# Patient Record
Sex: Female | Born: 1985
Health system: Southern US, Community
[De-identification: ages and names within clinical notes are randomized; demographics above are authoritative.]

## PROBLEM LIST (undated history)

## (undated) DIAGNOSIS — R102 Pelvic and perineal pain: Secondary | ICD-10-CM

## (undated) DIAGNOSIS — R519 Headache, unspecified: Secondary | ICD-10-CM

## (undated) DIAGNOSIS — D649 Anemia, unspecified: Secondary | ICD-10-CM

## (undated) DIAGNOSIS — L0293 Carbuncle, unspecified: Secondary | ICD-10-CM

## (undated) DIAGNOSIS — D170 Benign lipomatous neoplasm of skin and subcutaneous tissue of head, face and neck: Secondary | ICD-10-CM

## (undated) DIAGNOSIS — L0292 Furuncle, unspecified: Secondary | ICD-10-CM

## (undated) DIAGNOSIS — R51 Headache: Secondary | ICD-10-CM

## (undated) DIAGNOSIS — R1011 Right upper quadrant pain: Secondary | ICD-10-CM

## (undated) DIAGNOSIS — N946 Dysmenorrhea, unspecified: Secondary | ICD-10-CM

## (undated) DIAGNOSIS — R112 Nausea with vomiting, unspecified: Secondary | ICD-10-CM

## (undated) HISTORY — DX: Nausea with vomiting, unspecified: R11.2

## (undated) HISTORY — DX: Pelvic and perineal pain: R10.2

## (undated) HISTORY — DX: Right upper quadrant pain: R10.11

## (undated) HISTORY — PX: APPENDECTOMY: SHX54

## (undated) HISTORY — DX: Dysmenorrhea, unspecified: N94.6

## (undated) HISTORY — DX: Carbuncle, unspecified: L02.93

## (undated) HISTORY — DX: Furuncle, unspecified: L02.92

## (undated) HISTORY — DX: Benign lipomatous neoplasm of skin and subcutaneous tissue of head, face and neck: D17.0

## (undated) HISTORY — DX: Headache, unspecified: R51.9

## (undated) HISTORY — DX: Headache: R51

---
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
## 2005-08-12 ENCOUNTER — Ambulatory Visit (HOSPITAL_COMMUNITY): Admission: RE | Admit: 2005-08-12 | Discharge: 2005-08-12 | Payer: Self-pay | Admitting: Family Medicine

## 2005-11-04 ENCOUNTER — Inpatient Hospital Stay (HOSPITAL_COMMUNITY): Admission: AD | Admit: 2005-11-04 | Discharge: 2005-11-04 | Payer: Self-pay | Admitting: Obstetrics and Gynecology

## 2005-11-04 ENCOUNTER — Ambulatory Visit: Payer: Self-pay | Admitting: Gynecology

## 2005-11-04 ENCOUNTER — Inpatient Hospital Stay (HOSPITAL_COMMUNITY): Admission: AD | Admit: 2005-11-04 | Discharge: 2005-11-04 | Payer: Self-pay | Admitting: Gynecology

## 2005-11-04 ENCOUNTER — Inpatient Hospital Stay (HOSPITAL_COMMUNITY): Admission: AD | Admit: 2005-11-04 | Discharge: 2005-11-06 | Payer: Self-pay | Admitting: Gynecology

## 2006-04-24 ENCOUNTER — Emergency Department (HOSPITAL_COMMUNITY): Admission: EM | Admit: 2006-04-24 | Discharge: 2006-04-24 | Payer: Self-pay | Admitting: Emergency Medicine

## 2006-07-13 ENCOUNTER — Emergency Department (HOSPITAL_COMMUNITY): Admission: EM | Admit: 2006-07-13 | Discharge: 2006-07-13 | Payer: Self-pay | Admitting: Emergency Medicine

## 2014-09-12 ENCOUNTER — Encounter (HOSPITAL_BASED_OUTPATIENT_CLINIC_OR_DEPARTMENT_OTHER): Payer: Self-pay | Admitting: *Deleted

## 2014-09-12 ENCOUNTER — Emergency Department (HOSPITAL_BASED_OUTPATIENT_CLINIC_OR_DEPARTMENT_OTHER): Payer: Medicaid Other

## 2014-09-12 ENCOUNTER — Emergency Department (HOSPITAL_BASED_OUTPATIENT_CLINIC_OR_DEPARTMENT_OTHER)
Admission: EM | Admit: 2014-09-12 | Discharge: 2014-09-12 | Disposition: A | Discharge status: Home / Self Care | Payer: Medicaid Other | Attending: Emergency Medicine | Admitting: Emergency Medicine

## 2014-09-12 DIAGNOSIS — Y9389 Activity, other specified: Secondary | ICD-10-CM | POA: Diagnosis not present

## 2014-09-12 DIAGNOSIS — S63602A Unspecified sprain of left thumb, initial encounter: Secondary | ICD-10-CM

## 2014-09-12 DIAGNOSIS — Y998 Other external cause status: Secondary | ICD-10-CM | POA: Insufficient documentation

## 2014-09-12 DIAGNOSIS — Y9289 Other specified places as the place of occurrence of the external cause: Secondary | ICD-10-CM | POA: Diagnosis not present

## 2014-09-12 DIAGNOSIS — S6992XA Unspecified injury of left wrist, hand and finger(s), initial encounter: Secondary | ICD-10-CM | POA: Diagnosis present

## 2014-09-12 DIAGNOSIS — Z720 Tobacco use: Secondary | ICD-10-CM | POA: Diagnosis not present

## 2014-09-12 DIAGNOSIS — W51XXXA Accidental striking against or bumped into by another person, initial encounter: Secondary | ICD-10-CM | POA: Diagnosis not present

## 2014-09-12 MED ORDER — IBUPROFEN 800 MG PO TABS
800.0000 mg | ORAL_TABLET | Freq: Once | ORAL | Status: AC
  Administered 2014-09-12: 800 mg via ORAL
  Filled 2014-09-12: qty 1
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
## 2014-09-12 NOTE — ED Notes (Signed)
Patient transported to X-ray 

## 2014-09-12 NOTE — ED Notes (Signed)
Pt provided ice pack and instructed to elevate hand for comfort measures

## 2014-09-12 NOTE — Discharge Instructions (Signed)
Take ibuprofen or naproxen every 6-8 hours as needed for pain.  Finger Sprain A finger sprain is a tear in one of the strong, fibrous tissues that connect the bones (ligaments) in your finger. The severity of the sprain depends on how much of the ligament is torn. The tear can be either partial or complete. CAUSES  Often, sprains are a result of a fall or accident. If you extend your hands to catch an object or to protect yourself, the force of the impact causes the fibers of your ligament to stretch too much. This excess tension causes the fibers of your ligament to tear. SYMPTOMS  You may have some loss of motion in your finger. Other symptoms include:  Bruising.  Tenderness.  Swelling. DIAGNOSIS  In order to diagnose finger sprain, your caregiver will physically examine your finger or thumb to determine how torn the ligament is. Your caregiver may also suggest an X-ray exam of your finger to make sure no bones are broken. TREATMENT  If your ligament is only partially torn, treatment usually involves keeping the finger in a fixed position (immobilization) for a short period. To do this, your caregiver will apply a bandage, cast, or splint to keep your finger from moving until it heals. For a partially torn ligament, the healing process usually takes 2 to 3 weeks. If your ligament is completely torn, you may need surgery to reconnect the ligament to the bone. After surgery a cast or splint will be applied and will need to stay on your finger or thumb for 4 to 6 weeks while your ligament heals. HOME CARE INSTRUCTIONS  Keep your injured finger elevated, when possible, to decrease swelling.  To ease pain and swelling, apply ice to your joint twice a day, for 2 to 3 days:  Put ice in a plastic bag.  Place a towel between your skin and the bag.  Leave the ice on for 15 minutes.  Only take over-the-counter or prescription medicine for pain as directed by your caregiver.  Do not wear rings  on your injured finger.  Do not leave your finger unprotected until pain and stiffness go away (usually 3 to 4 weeks).  Do not allow your cast or splint to get wet. Cover your cast or splint with a plastic bag when you shower or bathe. Do not swim.  Your caregiver may suggest special exercises for you to do during your recovery to prevent or limit permanent stiffness. SEEK IMMEDIATE MEDICAL CARE IF:  Your cast or splint becomes damaged.  Your pain becomes worse rather than better. MAKE SURE YOU:  Understand these instructions.  Will watch your condition.  Will get help right away if you are not doing well or get worse. Document Released: 04/16/2004 Document Revised: 06/01/2011 Document Reviewed: 11/10/2010 [facility] Patient Information 2015 [place], [place]. This information is not intended to replace advice given to you by your health care provider. Make sure you discuss any questions you have with your health care provider. RICE: Routine Care for Injuries The routine care of many injuries includes Rest, Ice, Compression, and Elevation (RICE). HOME CARE INSTRUCTIONS  Rest is needed to allow your body to heal. Routine activities can usually be resumed when comfortable. Injured tendons and bones can take up to 6 weeks to heal. Tendons are the cord-like structures that attach muscle to bone.  Ice following an injury helps keep the swelling down and reduces pain.  Put ice in a plastic bag.  Place a towel between  your skin and the bag.  Leave the ice on for 15-20 minutes, 3-4 times a day, or as directed by your health care provider. Do this while awake, for the first 24 to 48 hours. After that, continue as directed by your caregiver.  Compression helps keep swelling down. It also gives support and helps with discomfort. If an elastic bandage has been applied, it should be removed and reapplied every 3 to 4 hours. It should not be applied tightly, but firmly enough to keep swelling down.  Watch fingers or toes for swelling, bluish discoloration, coldness, numbness, or excessive pain. If any of these problems occur, remove the bandage and reapply loosely. Contact your caregiver if these problems continue.  Elevation helps reduce swelling and decreases pain. With extremities, such as the arms, hands, legs, and feet, the injured area should be placed near or above the level of the heart, if possible. SEEK IMMEDIATE MEDICAL CARE IF:  You have persistent pain and swelling.  You develop redness, numbness, or unexpected weakness.  Your symptoms are getting worse rather than improving after several days. These symptoms may indicate that further evaluation or further X-rays are needed. Sometimes, X-rays may not show a small broken bone (fracture) until 1 week or 10 days later. Make a follow-up appointment with your caregiver. Ask when your X-ray results will be ready. Make sure you get your X-ray results. Document Released: 06/21/2000 Document Revised: 03/14/2013 Document Reviewed: 08/08/2010 [facility] Patient Information 2015 [place], [place]. This information is not intended to replace advice given to you by your health care provider. Make sure you discuss any questions you have with your health care provider.
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
## 2015-07-04 ENCOUNTER — Other Ambulatory Visit: Payer: Self-pay | Admitting: Obstetrics & Gynecology

## 2015-07-04 DIAGNOSIS — O3680X0 Pregnancy with inconclusive fetal viability, not applicable or unspecified: Secondary | ICD-10-CM

## 2015-07-09 ENCOUNTER — Encounter: Payer: Self-pay | Admitting: Adult Health

## 2015-07-09 ENCOUNTER — Ambulatory Visit (INDEPENDENT_AMBULATORY_CARE_PROVIDER_SITE_OTHER): Payer: Medicaid Other | Admitting: Adult Health

## 2015-07-09 ENCOUNTER — Ambulatory Visit: Payer: Medicaid Other

## 2015-07-09 VITALS — BP 120/82 | HR 68 | Ht 61.0 in | Wt 190.5 lb

## 2015-07-09 DIAGNOSIS — N9489 Other specified conditions associated with female genital organs and menstrual cycle: Secondary | ICD-10-CM

## 2015-07-09 DIAGNOSIS — R102 Pelvic and perineal pain: Secondary | ICD-10-CM | POA: Insufficient documentation

## 2015-07-09 DIAGNOSIS — R1011 Right upper quadrant pain: Secondary | ICD-10-CM | POA: Insufficient documentation

## 2015-07-09 DIAGNOSIS — N920 Excessive and frequent menstruation with regular cycle: Secondary | ICD-10-CM | POA: Diagnosis not present

## 2015-07-09 DIAGNOSIS — N946 Dysmenorrhea, unspecified: Secondary | ICD-10-CM

## 2015-07-09 DIAGNOSIS — Z3202 Encounter for pregnancy test, result negative: Secondary | ICD-10-CM

## 2015-07-09 DIAGNOSIS — R112 Nausea with vomiting, unspecified: Secondary | ICD-10-CM

## 2015-07-09 HISTORY — DX: Right upper quadrant pain: R10.11

## 2015-07-09 HISTORY — DX: Pelvic and perineal pain: R10.2

## 2015-07-09 HISTORY — DX: Nausea with vomiting, unspecified: R11.2

## 2015-07-09 HISTORY — DX: Dysmenorrhea, unspecified: N94.6

## 2015-07-09 LAB — POCT URINALYSIS DIPSTICK
Blood, UA: NEGATIVE
Glucose, UA: NEGATIVE
LEUKOCYTES UA: NEGATIVE
Nitrite, UA: NEGATIVE

## 2015-07-09 LAB — POCT URINE PREGNANCY: Preg Test, Ur: NEGATIVE

## 2015-07-09 MED ORDER — PROMETHAZINE HCL 25 MG PO TABS: 25.0000 mg | ORAL_TABLET | Freq: Four times a day (QID) | ORAL | Status: DC | PRN

## 2015-07-09 MED ORDER — IBUPROFEN 800 MG PO TABS: 800.0000 mg | ORAL_TABLET | Freq: Three times a day (TID) | ORAL | Status: DC | PRN

## 2015-07-09 NOTE — Progress Notes (Signed)
Subjective:     Patient ID: Cassidy Braun, female   DOB: 07-29-1985, 30 y.o.   MRN: [id number]  HPI Cassidy Braun is a 30 year old black female, in as work in, she had had [place] scheduled for dating OB, but had negative UPT.She was seen at [facility] for a pap and physical 3/23 and had +UPT, and started bleeding 3/27 and was seen at [facility] and had negative pregnancy test.She says for last few months periods are heavier and has cramps.She also complains of RUQ pain and nausea and vomiting before and after eating,and pelvic pressure, she says she would like another child some time.  Review of Systems Patient denies any daily headaches(has has frequent headaches has lipoma of head and needs to see MD at [facility]), hearing loss, fatigue, blurred vision, shortness of breath, chest pain, problems with bowel movements, urination, or intercourse. No joint pain or mood swings.See HPI for positives.She also has frequent boils.  Reviewed past medical,surgical, social and family history. Reviewed medications and allergies.     Objective:   Physical Exam BP 120/82 mmHg  Pulse 68  Ht 5\' 1"  (1.549 m)  Wt 190 lb 8 oz (86.41 kg)  BMI 36.01 kg/m2  LMP 06/17/2015 UPT negative, urine dipstick trace protein, Skin warm and dry. Neck: mid line trachea, normal thyroid, good ROM, no lymphadenopathy noted. Lungs: clear to ausculation bilaterally. Cardiovascular: regular rate and rhythm.Abdomen is soft and tender RUQ, no HSM noted. Pelvic: external genitalia is normal in appearance, has scars form old boils, vagina: has good color, moisture and rugae,urethra has no lesions or masses noted, cervix:smooth and bulbous, uterus: normal size, shape and contour, mildly tender, no masses felt, adnexa: no masses or tenderness noted. Bladder is non tender and no masses felt. No CVAT but says back has been sore since epidural.Discused to use antibacterial soap and keep skin clean and dry to help prevent  boils.   HGB was 12 in ER at [facility].Will schedule [place] to assess gallbladder and uterus at hospital. Face time 30 minutes.  Assessment:       Dysmenorrhea Negative UPT RUQ pain  Pelvic pressure Menorrhagia  Nausea and vomiting    Plan:    Eat often and lite, no grease  Will get Abdominal and pelvic US at [facility] 4/20 at 7:30 am Rx motrin 800 mg #60 take 1 every 8 hours prn pain with 1 refill Rx phenergan 25 mg #30 take 1 every 6 hours prn with 1 refill GC/CHL sent on urine  Follow up in 2 weeks for Pap and physical

## 2015-07-09 NOTE — Patient Instructions (Signed)
Get [place] at [facility] 4/20 at 7:30 do not eat or drink and take water Follow in 2 weeks for pap and physical  Eat often and lite, not greasy

## 2015-07-10 LAB — GC/CHLAMYDIA PROBE AMP
Chlamydia trachomatis, NAA: NEGATIVE
NEISSERIA GONORRHOEAE BY PCR: NEGATIVE

## 2015-07-11 ENCOUNTER — Ambulatory Visit (HOSPITAL_COMMUNITY)
Admission: RE | Admit: 2015-07-11 | Discharge: 2015-07-11 | Disposition: A | Discharge status: Home / Self Care | Payer: Medicaid Other | Source: Ambulatory Visit | Attending: Adult Health | Admitting: Adult Health

## 2015-07-11 ENCOUNTER — Ambulatory Visit (HOSPITAL_COMMUNITY): Payer: Medicaid Other

## 2015-07-11 DIAGNOSIS — N920 Excessive and frequent menstruation with regular cycle: Secondary | ICD-10-CM | POA: Insufficient documentation

## 2015-07-11 DIAGNOSIS — R1011 Right upper quadrant pain: Secondary | ICD-10-CM | POA: Insufficient documentation

## 2015-07-11 DIAGNOSIS — N9489 Other specified conditions associated with female genital organs and menstrual cycle: Secondary | ICD-10-CM | POA: Diagnosis present

## 2015-07-11 DIAGNOSIS — K8020 Calculus of gallbladder without cholecystitis without obstruction: Secondary | ICD-10-CM | POA: Insufficient documentation

## 2015-07-11 DIAGNOSIS — R102 Pelvic and perineal pain: Secondary | ICD-10-CM

## 2015-07-11 DIAGNOSIS — N946 Dysmenorrhea, unspecified: Secondary | ICD-10-CM | POA: Diagnosis present

## 2015-07-12 ENCOUNTER — Telehealth: Payer: Self-pay | Admitting: Adult Health

## 2015-07-12 NOTE — Telephone Encounter (Signed)
Pt aware pelvic US normal but on abd [place] has gallstone in neck of gall bladder will refer to Dr Arnoldo Morale

## 2015-07-23 ENCOUNTER — Other Ambulatory Visit: Payer: Medicaid Other | Admitting: Adult Health

## 2015-07-29 ENCOUNTER — Other Ambulatory Visit: Payer: Medicaid Other | Admitting: Adult Health

## 2015-08-27 NOTE — H&P (Signed)
  NTS SOAP Note  Vital Signs:  Vitals as of: [id number]: Systolic 0000000: Diastolic 81: Heart Rate 79: Temp 75F (Temporal): Height 2ft 1in: Weight 194Lbs 0 Ounces: Pain Level 8: BMI 36.66   BMI : 36.66 kg/m2  Subjective: This 30 year old female presents for of right upper quadrant abdominal pain.  Referred by Dr. Glo Herring.  Recently seen in ER for nausea and abdominal pain.  Diagnosed with gallstone by U/S.  LFT's negative.  States she gets worse with fatty food.  No fever, chills, jaundice.  Feels bloated.  Is terminating the pregnancy.  Has been going on intermittently for many months.  Seems to be getting worse.  Recently terminated a pregnancy.  Review of Symptoms:  Constitutional:fatigue headache Eyes:blurred vision bilateral sinus problems Cardiovascular:negative Respiratory:cough Gastrointestinabdominal pain, nausea, heartburn, dyspepsia Genitourinary:frequency joint, neck, and back pain Skin:negative Hematolgic/Lymphatic:negative Allergic/Immunologic:negative   Past Medical History:Reviewed  Past Medical History  Pmed:  negative Psurg:  negative Meds:  ibuprofen, phenergan Allergies:  nkda   Social History:Reviewed  Social History  Preferred Language: English Race:  Other Ethnicity: Not Hispanic / Latino Age: 89 year Marital Status:  S Alcohol: rarely   Smoking Status: Light tobacco smoker reviewed on 07/24/2015 Started Date:  Packs per week:  Functional Status reviewed on 07/24/2015 ------------------------------------------------ Bathing: Normal Cooking: Normal Dressing: Normal Driving: Normal Eating: Normal Managing Meds: Normal Oral Care: Normal Shopping: Normal Toileting: Normal Transferring: Normal Walking: Normal Cognitive Status reviewed on 07/24/2015 ------------------------------------------------ Attention: Normal Decision Making: Normal Language: Normal Memory: Normal Motor: Normal Perception: Normal Problem Solving:  Normal Visual and Spatial: Normal   Family History:Reviewed  Family Health History Mother, Healthy;  Father, Healthy;     Objective Information: General:Well appearing, well nourished in no distress. no scleral icterus Neck:Supple without lymphadenopathy.  Heart:RRR, no murmur or gallop.  Normal S1, S2.  No S3, S4.  Lungs:CTA bilaterally, no wheezes, rhonchi, rales.  Breathing unlabored. Abdomen:Soft, NT/ND, normal bowel sounds, no HSM, no masses.  No peritoneal signs. U/S report reviewed Assessment:Cholelithiasis, recent termination of pregnancy  Diagnoses: 574.20  K80.20 Cholelithiasis without obstruction (Calculus of gallbladder without cholecystitis without obstruction)  Procedures: [id number] - OFFICE OUTPATIENT NEW 30 MINUTES   Plan:  Scheduled for laparoscopic cholecystectomy on 09/02/15.  Risks and benefits of procedure including bleeding, infection, hepatobiliary injury, and the possibility of an open procedure were fully explained to the patient, who gives informed consent.

## 2015-08-27 NOTE — Patient Instructions (Signed)
Cassidy Braun  08/27/2015     @PREFPERIOPPHARMACY @   Your procedure is scheduled on  09/02/2015  Report to [facility] at  615  A.M.  Call this number if you have problems the morning of surgery:  [phone]   Remember:  Do not eat food or drink liquids after midnight.  Take these medicines the morning of surgery with A SIP OF WATER  Phenergan.   Do not wear jewelry, make-up or nail polish.  Do not wear lotions, powders, or perfumes.  You may wear deodorant.  Do not shave 48 hours prior to surgery.  Men may shave face and neck.  Do not bring valuables to the hospital.  [facility] is not responsible for any belongings or valuables.  Contacts, dentures or bridgework may not be worn into surgery.  Leave your suitcase in the car.  After surgery it may be brought to your room.  For patients admitted to the hospital, discharge time will be determined by your treatment team.  Patients discharged the day of surgery will not be allowed to drive home.   Name and phone number of your driver:   family Special instructions:  none  Please read over the following fact sheets that you were given. Coughing and Deep Breathing, Surgical Site Infection Prevention, Anesthesia Post-op Instructions and Care and Recovery After Surgery      Laparoscopic Cholecystectomy Laparoscopic cholecystectomy is surgery to remove the gallbladder. The gallbladder is located in the upper right part of the abdomen, behind the liver. It is a storage sac for bile, which is produced in the liver. Bile aids in the digestion and absorption of fats. Cholecystectomy is often done for inflammation of the gallbladder (cholecystitis). This condition is usually caused by a buildup of gallstones (cholelithiasis) in the gallbladder. Gallstones can block the flow of bile, and that can result in inflammation and pain. In severe cases, emergency surgery may be required. If emergency surgery is not required, you will  have time to prepare for the procedure. Laparoscopic surgery is an alternative to open surgery. Laparoscopic surgery has a shorter recovery time. Your common bile duct may also need to be examined during the procedure. If stones are found in the common bile duct, they may be removed. LET [facility] CARE PROVIDER KNOW ABOUT:  Any allergies you have.  All medicines you are taking, including vitamins, herbs, eye drops, creams, and over-the-counter medicines.  Previous problems you or members of your family have had with the use of anesthetics.  Any blood disorders you have.  Previous surgeries you have had.  Any medical conditions you have. RISKS AND COMPLICATIONS Generally, this is a safe procedure. However, problems may occur, including:  Infection.  Bleeding.  Allergic reactions to medicines.  Damage to other structures or organs.  A stone remaining in the common bile duct.  A bile leak from the cyst duct that is clipped when your gallbladder is removed.  The need to convert to open surgery, which requires a larger incision in the abdomen. This may be necessary if your surgeon thinks that it is not safe to continue with a laparoscopic procedure. BEFORE THE PROCEDURE  Ask your health care provider about:  Changing or stopping your regular medicines. This is especially important if you are taking diabetes medicines or blood thinners.  Taking medicines such as aspirin and ibuprofen. These medicines can thin your blood. Do not take these medicines before your procedure if your  health care provider instructs you not to.  Follow instructions from your health care provider about eating or drinking restrictions.  Let your health care provider know if you develop a cold or an infection before surgery.  Plan to have someone take you home after the procedure.  Ask your health care provider how your surgical site will be marked or identified.  You may be given antibiotic medicine  to help prevent infection. PROCEDURE  To reduce your risk of infection:  Your health care team will wash or sanitize their hands.  Your skin will be washed with soap.  An IV tube may be inserted into one of your veins.  You will be given a medicine to make you fall asleep (general anesthetic).  A breathing tube will be placed in your mouth.  The surgeon will make several small cuts (incisions) in your abdomen.  A thin, lighted tube (laparoscope) that has a tiny camera on the end will be inserted through one of the small incisions. The camera on the laparoscope will send a picture to a TV screen (monitor) in the operating room. This will give the surgeon a good view inside your abdomen.  A gas will be pumped into your abdomen. This will expand your abdomen to give the surgeon more room to perform the surgery.  Other tools that are needed for the procedure will be inserted through the other incisions. The gallbladder will be removed through one of the incisions.  After your gallbladder has been removed, the incisions will be closed with stitches (sutures), staples, or skin glue.  Your incisions may be covered with a bandage (dressing). The procedure may vary among health care providers and hospitals. AFTER THE PROCEDURE  Your blood pressure, heart rate, breathing rate, and blood oxygen level will be monitored often until the medicines you were given have worn off.  You will be given medicines as needed to control your pain.   This information is not intended to replace advice given to you by your health care provider. Make sure you discuss any questions you have with your health care provider.   Document Released: 03/09/2005 Document Revised: 11/28/2014 Document Reviewed: 10/19/2012 Elsevier Interactive Patient Education 2016 [employer] Laparoscopic Cholecystectomy, Care After These instructions give you information about caring for yourself after your procedure. Your doctor  may also give you more specific instructions. Call your doctor if you have any problems or questions after your procedure. HOME CARE Incision Care  Follow instructions from your doctor about how to take care of your cuts from surgery (incisions). Make sure you:  Wash your hands with soap and water before you change your bandage (dressing). If you cannot use soap and water, use hand sanitizer.  Change your bandage as told by your doctor.  Leave stitches (sutures), skin glue, or skin tape (adhesive) strips in place. They may need to stay in place for 2 weeks or longer. If tape strips get loose and curl up, you may trim the loose edges. Do not remove tape strips completely unless your doctor says it is okay.  Do not take baths, swim, or use a hot tub until your doctor says it is okay. Ask your doctor if you can take showers. You may only be allowed to take sponge baths. General Instructions  Take over-the-counter and prescription medicines only as told by your doctor.  Do not drive or use heavy machinery while taking prescription pain medicine.  Return to your normal diet as told by your  doctor.  Do not lift anything that is heavier than 10 lb (4.5 kg).  Do not play contact sports for 1 week or until your doctor says it is okay. GET HELP IF:  You have redness, swelling, or pain at the site of your surgical cuts.  You have fluid, blood, or pus coming from your cuts.  You notice a bad smell coming from your cut area.  Your surgical cuts break open.  You have a fever. GET HELP RIGHT AWAY IF:   You have a rash.  You have trouble breathing.  You have chest pain.  You have pain in your shoulders (shoulder strap areas) that is getting worse.  You pass out (faint) or feel dizzy while you are standing.  You have very bad pain in your belly (abdomen).  You feel sick to your stomach (nauseous) or throw up (vomit) for more than 1 day.   This information is not intended to replace  advice given to you by your health care provider. Make sure you discuss any questions you have with your health care provider.   Document Released: 12/17/2007 Document Revised: 11/28/2014 Document Reviewed: 10/19/2012 Elsevier Interactive Patient Education 2016 [employer] PATIENT INSTRUCTIONS POST-ANESTHESIA  IMMEDIATELY FOLLOWING SURGERY:  Do not drive or operate machinery for the first twenty four hours after surgery.  Do not make any important decisions for twenty four hours after surgery or while taking narcotic pain medications or sedatives.  If you develop intractable nausea and vomiting or a severe headache please notify your doctor immediately.  FOLLOW-UP:  Please make an appointment with your surgeon as instructed. You do not need to follow up with anesthesia unless specifically instructed to do so.  WOUND CARE INSTRUCTIONS (if applicable):  Keep a dry clean dressing on the anesthesia/puncture wound site if there is drainage.  Once the wound has quit draining you may leave it open to air.  Generally you should leave the bandage intact for twenty four hours unless there is drainage.  If the epidural site drains for more than 36-48 hours please call the anesthesia department.  QUESTIONS?:  Please feel free to call your physician or the hospital operator if you have any questions, and they will be happy to assist you.

## 2015-08-28 ENCOUNTER — Encounter (HOSPITAL_COMMUNITY)
Admission: RE | Admit: 2015-08-28 | Discharge: 2015-08-28 | Disposition: A | Discharge status: Home / Self Care | Payer: Medicaid Other | Source: Ambulatory Visit | Attending: General Surgery | Admitting: General Surgery

## 2015-08-28 ENCOUNTER — Encounter (HOSPITAL_COMMUNITY): Payer: Self-pay

## 2015-08-28 DIAGNOSIS — Z01812 Encounter for preprocedural laboratory examination: Secondary | ICD-10-CM | POA: Insufficient documentation

## 2015-08-28 LAB — HEPATIC FUNCTION PANEL
ALBUMIN: 4.2 g/dL (ref 3.5–5.0)
ALK PHOS: 51 U/L (ref 38–126)
ALT: 13 U/L — AB (ref 14–54)
AST: 14 U/L — AB (ref 15–41)
BILIRUBIN TOTAL: 0.3 mg/dL (ref 0.3–1.2)
Bilirubin, Direct: <0.1 mg/dL — ABNORMAL LOW (ref 0.1–0.5)
Total Protein: 7.7 g/dL (ref 6.5–8.1)

## 2015-08-28 LAB — BASIC METABOLIC PANEL
Anion gap: 10 (ref 5–15)
BUN: 12 mg/dL (ref 6–20)
CHLORIDE: 106 mmol/L (ref 101–111)
CO2: 22 mmol/L (ref 22–32)
CREATININE: 0.59 mg/dL (ref 0.44–1.00)
Calcium: 8.7 mg/dL — ABNORMAL LOW (ref 8.9–10.3)
GFR calc Af Amer: >60 mL/min (ref >60)
GFR calc non Af Amer: >60 mL/min (ref >60)
Glucose, Bld: 82 mg/dL (ref 65–99)
Potassium: 3.3 mmol/L — ABNORMAL LOW (ref 3.5–5.1)
Sodium: 138 mmol/L (ref 135–145)

## 2015-08-28 LAB — HCG, SERUM, QUALITATIVE: Preg, Serum: NEGATIVE

## 2015-08-28 LAB — CBC WITH DIFFERENTIAL/PLATELET
Basophils Absolute: 0 10*3/uL (ref 0.0–0.1)
Basophils Relative: 0 %
EOS ABS: 0.1 10*3/uL (ref 0.0–0.7)
EOS PCT: 1 %
HCT: 37.2 % (ref 36.0–46.0)
HEMOGLOBIN: 12.6 g/dL (ref 12.0–15.0)
LYMPHS ABS: 4.6 10*3/uL — AB (ref 0.7–4.0)
Lymphocytes Relative: 49 %
MCH: 30.9 pg (ref 26.0–34.0)
MCHC: 33.9 g/dL (ref 30.0–36.0)
MCV: 91.2 fL (ref 78.0–100.0)
MONOS PCT: 6 %
Monocytes Absolute: 0.6 10*3/uL (ref 0.1–1.0)
Neutro Abs: 4.1 10*3/uL (ref 1.7–7.7)
Neutrophils Relative %: 44 %
PLATELETS: 296 10*3/uL (ref 150–400)
RBC: 4.08 MIL/uL (ref 3.87–5.11)
RDW: 12.9 % (ref 11.5–15.5)
WBC: 9.4 10*3/uL (ref 4.0–10.5)

## 2015-08-28 NOTE — Pre-Procedure Instructions (Signed)
Patient given information to sign up for my chart at home. 

## 2015-09-02 ENCOUNTER — Ambulatory Visit (HOSPITAL_COMMUNITY): Payer: Medicaid Other | Admitting: Anesthesiology

## 2015-09-02 ENCOUNTER — Ambulatory Visit (HOSPITAL_COMMUNITY)
Admission: RE | Admit: 2015-09-02 | Discharge: 2015-09-02 | Disposition: A | Discharge status: Home / Self Care | Payer: Medicaid Other | Source: Ambulatory Visit | Attending: General Surgery | Admitting: General Surgery

## 2015-09-02 ENCOUNTER — Encounter (HOSPITAL_COMMUNITY): Payer: Self-pay | Admitting: *Deleted

## 2015-09-02 ENCOUNTER — Encounter (HOSPITAL_COMMUNITY)
Admission: RE | Disposition: A | Discharge status: Home / Self Care | Payer: Self-pay | Source: Ambulatory Visit | Attending: General Surgery

## 2015-09-02 DIAGNOSIS — K8020 Calculus of gallbladder without cholecystitis without obstruction: Secondary | ICD-10-CM | POA: Diagnosis present

## 2015-09-02 DIAGNOSIS — F17200 Nicotine dependence, unspecified, uncomplicated: Secondary | ICD-10-CM | POA: Diagnosis not present

## 2015-09-02 HISTORY — PX: CHOLECYSTECTOMY: SHX55

## 2015-09-02 SURGERY — LAPAROSCOPIC CHOLECYSTECTOMY
Anesthesia: General | Site: Abdomen

## 2015-09-02 MED ORDER — BUPIVACAINE HCL (PF) 0.5 % IJ SOLN
INTRAMUSCULAR | Status: DC | PRN
  Administered 2015-09-02: 10 mL

## 2015-09-02 MED ORDER — LIDOCAINE HCL (PF) 1 % IJ SOLN
INTRAMUSCULAR | Status: AC
  Filled 2015-09-02: qty 5

## 2015-09-02 MED ORDER — CIPROFLOXACIN IN D5W 400 MG/200ML IV SOLN
400.0000 mg | INTRAVENOUS | Status: AC
  Administered 2015-09-02: 400 mg via INTRAVENOUS

## 2015-09-02 MED ORDER — ROCURONIUM BROMIDE 50 MG/5ML IV SOLN
INTRAVENOUS | Status: AC
  Filled 2015-09-02: qty 1

## 2015-09-02 MED ORDER — OXYCODONE-ACETAMINOPHEN 7.5-325 MG PO TABS: 1.0000 | ORAL_TABLET | ORAL | Status: DC | PRN

## 2015-09-02 MED ORDER — BUPIVACAINE HCL (PF) 0.5 % IJ SOLN
INTRAMUSCULAR | Status: AC
  Filled 2015-09-02: qty 30

## 2015-09-02 MED ORDER — DIPHENHYDRAMINE HCL 50 MG/ML IJ SOLN
INTRAMUSCULAR | Status: AC
  Filled 2015-09-02: qty 1

## 2015-09-02 MED ORDER — NEOSTIGMINE METHYLSULFATE 10 MG/10ML IV SOLN
INTRAVENOUS | Status: DC | PRN
  Administered 2015-09-02: 5 mg via INTRAVENOUS

## 2015-09-02 MED ORDER — GLYCOPYRROLATE 0.2 MG/ML IJ SOLN
INTRAMUSCULAR | Status: AC
  Filled 2015-09-02: qty 1

## 2015-09-02 MED ORDER — PROPOFOL 10 MG/ML IV BOLUS
INTRAVENOUS | Status: DC | PRN
  Administered 2015-09-02: 50 mg via INTRAVENOUS
  Administered 2015-09-02: 150 mg via INTRAVENOUS

## 2015-09-02 MED ORDER — LACTATED RINGERS IV SOLN
INTRAVENOUS | Status: DC
  Administered 2015-09-02: 1000 mL via INTRAVENOUS

## 2015-09-02 MED ORDER — ONDANSETRON HCL 4 MG/2ML IJ SOLN
4.0000 mg | Freq: Once | INTRAMUSCULAR | Status: AC
  Administered 2015-09-02: 4 mg via INTRAVENOUS

## 2015-09-02 MED ORDER — LIDOCAINE HCL (CARDIAC) 10 MG/ML IV SOLN
INTRAVENOUS | Status: DC | PRN
  Administered 2015-09-02: 50 mg via INTRAVENOUS

## 2015-09-02 MED ORDER — HEMOSTATIC AGENTS (NO CHARGE) OPTIME
TOPICAL | Status: DC | PRN
  Administered 2015-09-02: 1 via TOPICAL

## 2015-09-02 MED ORDER — ROCURONIUM BROMIDE 100 MG/10ML IV SOLN
INTRAVENOUS | Status: DC | PRN
  Administered 2015-09-02: 5 mg via INTRAVENOUS
  Administered 2015-09-02: 35 mg via INTRAVENOUS

## 2015-09-02 MED ORDER — FENTANYL CITRATE (PF) 100 MCG/2ML IJ SOLN
25.0000 ug | INTRAMUSCULAR | Status: DC | PRN
  Administered 2015-09-02 (×3): 50 ug via INTRAVENOUS
  Filled 2015-09-02 (×2): qty 2

## 2015-09-02 MED ORDER — GLYCOPYRROLATE 0.2 MG/ML IJ SOLN
INTRAMUSCULAR | Status: AC
Start: 2015-09-02 — End: 2015-09-02
  Filled 2015-09-02: qty 3

## 2015-09-02 MED ORDER — FENTANYL CITRATE (PF) 250 MCG/5ML IJ SOLN
INTRAMUSCULAR | Status: AC
  Filled 2015-09-02: qty 5

## 2015-09-02 MED ORDER — MIDAZOLAM HCL 2 MG/2ML IJ SOLN
1.0000 mg | INTRAMUSCULAR | Status: DC | PRN
  Administered 2015-09-02: 2 mg via INTRAVENOUS

## 2015-09-02 MED ORDER — POVIDONE-IODINE 10 % EX OINT
TOPICAL_OINTMENT | CUTANEOUS | Status: AC
  Filled 2015-09-02: qty 1

## 2015-09-02 MED ORDER — ONDANSETRON HCL 4 MG/2ML IJ SOLN
4.0000 mg | Freq: Once | INTRAMUSCULAR | Status: AC | PRN
  Administered 2015-09-02: 4 mg via INTRAVENOUS
  Filled 2015-09-02: qty 2

## 2015-09-02 MED ORDER — GLYCOPYRROLATE 0.2 MG/ML IJ SOLN
INTRAMUSCULAR | Status: DC | PRN
Start: 2015-09-02 — End: 2015-09-02
  Administered 2015-09-02: .8 mg via INTRAVENOUS

## 2015-09-02 MED ORDER — CIPROFLOXACIN IN D5W 400 MG/200ML IV SOLN
INTRAVENOUS | Status: AC
  Filled 2015-09-02: qty 200

## 2015-09-02 MED ORDER — ONDANSETRON HCL 4 MG/2ML IJ SOLN
INTRAMUSCULAR | Status: AC
  Filled 2015-09-02: qty 2

## 2015-09-02 MED ORDER — NEOSTIGMINE METHYLSULFATE 10 MG/10ML IV SOLN
INTRAVENOUS | Status: AC
  Filled 2015-09-02: qty 1

## 2015-09-02 MED ORDER — CHLORHEXIDINE GLUCONATE 4 % EX LIQD: 1.0000 "application " | Freq: Once | CUTANEOUS | Status: DC

## 2015-09-02 MED ORDER — DIPHENHYDRAMINE HCL 50 MG/ML IJ SOLN
25.0000 mg | Freq: Once | INTRAMUSCULAR | Status: AC
  Administered 2015-09-02: 25 mg via INTRAVENOUS

## 2015-09-02 MED ORDER — POVIDONE-IODINE 10 % OINT PACKET
TOPICAL_OINTMENT | CUTANEOUS | Status: DC | PRN
  Administered 2015-09-02: 1 via TOPICAL

## 2015-09-02 MED ORDER — KETOROLAC TROMETHAMINE 30 MG/ML IJ SOLN
30.0000 mg | Freq: Once | INTRAMUSCULAR | Status: AC
  Administered 2015-09-02: 30 mg via INTRAVENOUS

## 2015-09-02 MED ORDER — KETOROLAC TROMETHAMINE 30 MG/ML IJ SOLN
INTRAMUSCULAR | Status: AC
  Filled 2015-09-02: qty 1

## 2015-09-02 MED ORDER — FENTANYL CITRATE (PF) 100 MCG/2ML IJ SOLN
INTRAMUSCULAR | Status: DC | PRN
  Administered 2015-09-02: 25 ug via INTRAVENOUS
  Administered 2015-09-02: 100 ug via INTRAVENOUS
  Administered 2015-09-02: 25 ug via INTRAVENOUS
  Administered 2015-09-02: 50 ug via INTRAVENOUS

## 2015-09-02 MED ORDER — SODIUM CHLORIDE 0.9 % IR SOLN
Status: DC | PRN
  Administered 2015-09-02: 1000 mL

## 2015-09-02 MED ORDER — MIDAZOLAM HCL 2 MG/2ML IJ SOLN
INTRAMUSCULAR | Status: AC
  Filled 2015-09-02: qty 2

## 2015-09-02 SURGICAL SUPPLY — 41 items
APPLIER CLIP LAPSCP 10X32 DD (CLIP) ×3 IMPLANT
BAG HAMPER (MISCELLANEOUS) ×3 IMPLANT
BAG SPEC RTRVL LRG 6X4 10 (ENDOMECHANICALS) ×1
CHLORAPREP W/TINT 26ML (MISCELLANEOUS) ×3 IMPLANT
CLOTH BEACON ORANGE TIMEOUT ST (SAFETY) ×3 IMPLANT
COVER LIGHT HANDLE STERIS (MISCELLANEOUS) ×6 IMPLANT
DECANTER SPIKE VIAL GLASS SM (MISCELLANEOUS) ×3 IMPLANT
ELECT REM PT RETURN 9FT ADLT (ELECTROSURGICAL) ×3
ELECTRODE REM PT RTRN 9FT ADLT (ELECTROSURGICAL) ×1 IMPLANT
FILTER SMOKE EVAC LAPAROSHD (FILTER) ×3 IMPLANT
FORMALIN 10 PREFIL 120ML (MISCELLANEOUS) ×3 IMPLANT
GLOVE BIOGEL PI IND STRL 7.0 (GLOVE) ×1 IMPLANT
GLOVE BIOGEL PI INDICATOR 7.0 (GLOVE) ×2
GLOVE SURG SS PI 7.5 STRL IVOR (GLOVE) ×3 IMPLANT
GOWN STRL REUS W/ TWL XL LVL3 (GOWN DISPOSABLE) ×1 IMPLANT
GOWN STRL REUS W/TWL LRG LVL3 (GOWN DISPOSABLE) ×6 IMPLANT
GOWN STRL REUS W/TWL XL LVL3 (GOWN DISPOSABLE) ×3
HEMOSTAT SNOW SURGICEL 2X4 (HEMOSTASIS) ×3 IMPLANT
INST SET LAPROSCOPIC AP (KITS) ×3 IMPLANT
IV NS IRRIG 3000ML ARTHROMATIC (IV SOLUTION) IMPLANT
KIT ROOM TURNOVER APOR (KITS) ×3 IMPLANT
MANIFOLD NEPTUNE II (INSTRUMENTS) ×3 IMPLANT
NDL INSUFFLATION 14GA 120MM (NEEDLE) ×1 IMPLANT
NEEDLE INSUFFLATION 14GA 120MM (NEEDLE) ×3 IMPLANT
NS IRRIG 1000ML POUR BTL (IV SOLUTION) ×3 IMPLANT
PACK LAP CHOLE LZT030E (CUSTOM PROCEDURE TRAY) ×3 IMPLANT
PAD ARMBOARD 7.5X6 YLW CONV (MISCELLANEOUS) ×3 IMPLANT
POUCH SPECIMEN RETRIEVAL 10MM (ENDOMECHANICALS) ×3 IMPLANT
SET BASIN LINEN APH (SET/KITS/TRAYS/PACK) ×3 IMPLANT
SET TUBE IRRIG SUCTION NO TIP (IRRIGATION / IRRIGATOR) IMPLANT
SLEEVE ENDOPATH XCEL 5M (ENDOMECHANICALS) ×3 IMPLANT
SPONGE GAUZE 2X2 8PLY STER LF (GAUZE/BANDAGES/DRESSINGS) ×4
SPONGE GAUZE 2X2 8PLY STRL LF (GAUZE/BANDAGES/DRESSINGS) ×8 IMPLANT
STAPLER VISISTAT (STAPLE) ×3 IMPLANT
SUT VICRYL 0 UR6 27IN ABS (SUTURE) ×3 IMPLANT
TROCAR ENDO BLADELESS 11MM (ENDOMECHANICALS) ×3 IMPLANT
TROCAR XCEL NON-BLD 5MMX100MML (ENDOMECHANICALS) ×3 IMPLANT
TROCAR XCEL UNIV SLVE 11M 100M (ENDOMECHANICALS) ×3 IMPLANT
TUBING INSUFFLATION (TUBING) ×3 IMPLANT
WARMER LAPAROSCOPE (MISCELLANEOUS) ×3 IMPLANT
YANKAUER SUCT 12FT TUBE ARGYLE (SUCTIONS) ×3 IMPLANT

## 2015-09-02 NOTE — Transfer of Care (Signed)
Immediate Anesthesia Transfer of Care Note  Patient: Cassidy Braun  Procedure(s) Performed: Procedure(s): LAPAROSCOPIC CHOLECYSTECTOMY (N/A)  Patient Location: PACU  Anesthesia Type:General  Level of Consciousness: sedated and patient cooperative  Airway & Oxygen Therapy: Patient Spontanous Breathing and non-rebreather face mask  Post-op Assessment: Report given to RN, Post -op Vital signs reviewed and stable and Patient moving all extremities  Post vital signs: Reviewed and stable   Last Pain: There were no vitals filed for this visit.    Patients Stated Pain Goal: 7 ([id number])  Complications: No apparent anesthesia complications

## 2015-09-02 NOTE — Interval H&P Note (Signed)
History and Physical Interval Note:  09/02/2015 7:15 AM  Cassidy Braun  has presented today for surgery, with the diagnosis of cholelithiasis  The various methods of treatment have been discussed with the patient and family. After consideration of risks, benefits and other options for treatment, the patient has consented to  Procedure(s): LAPAROSCOPIC CHOLECYSTECTOMY (N/A) as a surgical intervention .  The patient's history has been reviewed, patient examined, no change in status, stable for surgery.  I have reviewed the patient's chart and labs.  Questions were answered to the patient's satisfaction.     Aviva Signs A

## 2015-09-02 NOTE — Anesthesia Postprocedure Evaluation (Signed)
Anesthesia Post Note  Patient: Cassidy Braun  Procedure(s) Performed: Procedure(s) (LRB): LAPAROSCOPIC CHOLECYSTECTOMY (N/A)  Patient location during evaluation: PACU Anesthesia Type: General Level of consciousness: awake Pain control: 7/10. Vital Signs Assessment: post-procedure vital signs reviewed and stable Respiratory status: spontaneous breathing and non-rebreather facemask Cardiovascular status: stable Anesthetic complications: no Comments: Pain medicine being given/continued.    Last Vitals:  Filed Vitals:   09/02/15 0920 09/02/15 0928  BP:  116/70  Pulse: 102 69  Temp: 36.5 C 36.5 C  Resp: 24 18    Last Pain:  Filed Vitals:   09/02/15 0945  PainSc: 7                  Colton Tassin

## 2015-09-02 NOTE — Op Note (Signed)
Patient:  Cassidy Braun  DOB:  Jun 26, 1985  MRN:  [id number]   Preop Diagnosis:  Biliary colic, cholelithiasis  Postop Diagnosis:  Same  Procedure:  Laparoscopic cholecystectomy  Surgeon:  Aviva Signs, M.D.  Anes:  Gen. endotracheal  Indications:  Patient is a 30 year old black female who presents with biliary colic secondary to cholelithiasis. The risks and benefits of the procedure including bleeding, infection, hepatobiliary injury, and the possibility of an open procedure were fully explained to the patient, who gave informed consent.  Procedure note:  The patient was placed the supine position. After induction of general endotracheal anesthesia, the abdomen was prepped and draped using the usual sterile technique with DuraPrep. Surgical site confirmation was performed.  A supraumbilical incision was made down to the fascia. A Veress needle was introduced into the abdominal cavity and confirmation of placement was done using the saline drop test. The abdomen was then insufflated to 16 mmHg pressure. An 11 mm trocar was introduced into the abdominal cavity under direct visualization without difficulty. The patient was placed in reverse Trendelenburg position and an additional 11 mm trocar was placed the epigastric region and 5 mm trochars were placed the right upper quadrant and right flank regions. The liver was inspected and noted to be within normal limits. The gallbladder was retracted in a dynamic fashion in order to provide a critical view of the triangle of Calot. The cystic duct was first identified. Its juncture to the infundibulum was fully identified. Endoclips were placed proximally and distally on the cystic duct, and the cystic duct was divided. This was likewise done cystic artery. The gallbladder was freed away from the gallbladder fossa using Bovie cautery. The gallbladder was delivered through the epigastric trocar site using an Endo Catch bag. The gallbladder fossa was  inspected and no abnormal bleeding or bile leakage was noted. Surgicel was placed the gallbladder fossa. All fluid and air were then evacuated from the abdominal cavity prior to the removal of the trochars.  All wounds were irrigated with normal saline. All wounds were injected with 0.5% Sensorcaine. The supraumbilical fascia was reapproximated using an 0 Vicryl interrupted suture. All skin incisions were closed using staples. Betadine ointment and dry sterile dressings were applied.  All tape and needle counts were correct at the end of the procedure. Patient was extubated in the operating room and transferred to PACU in stable condition.  Complications:  None  EBL:  Minimal  Specimen:  Gallbladder

## 2015-09-02 NOTE — Anesthesia Procedure Notes (Signed)
Procedure Name: Intubation Date/Time: 09/02/2015 7:42 AM Performed by: Vista Deck Pre-anesthesia Checklist: Patient identified, Patient being monitored, Timeout performed, Emergency Drugs available and Suction available Patient Re-evaluated:Patient Re-evaluated prior to inductionOxygen Delivery Method: Circle System Utilized Preoxygenation: Pre-oxygenation with 100% oxygen Intubation Type: IV induction Ventilation: Mask ventilation without difficulty Laryngoscope Size: Miller and 2 Grade View: Grade I Tube type: Oral Tube size: 7.0 mm Number of attempts: 1 Airway Equipment and Method: Stylet and Oral airway Placement Confirmation: ETT inserted through vocal cords under direct vision,  positive ETCO2 and breath sounds checked- equal and bilateral Secured at: 21 cm Tube secured with: Tape Dental Injury: Teeth and Oropharynx as per pre-operative assessment

## 2015-09-02 NOTE — Discharge Instructions (Signed)
Laparoscopic Cholecystectomy, Care After °Refer to this sheet in the next few weeks. These instructions provide you with information about caring for yourself after your procedure. Your health care provider may also give you more specific instructions. Your treatment has been planned according to current medical practices, but problems sometimes occur. Call your health care provider if you have any problems or questions after your procedure. °WHAT TO EXPECT AFTER THE PROCEDURE °After your procedure, it is common to have: °· Pain at your incision sites. You will be given pain medicines to control your pain. °· Mild nausea or vomiting. This should improve after the first 24 hours. °· Bloating and possible shoulder pain from the gas that was used during the procedure. This will improve after the first 24 hours. °HOME CARE INSTRUCTIONS °Incision Care °· Follow instructions from your health care provider about how to take care of your incisions. Make sure you: °¨ Wash your hands with soap and water before you change your bandage (dressing). If soap and water are not available, use hand sanitizer. °¨ Change your dressing as told by your health care provider. °¨ Leave stitches (sutures), skin glue, or adhesive strips in place. These skin closures may need to be in place for 2 weeks or longer. If adhesive strip edges start to loosen and curl up, you may trim the loose edges. Do not remove adhesive strips completely unless your health care provider tells you to do that. °· Do not take baths, swim, or use a hot tub until your health care provider approves. Ask your health care provider if you can take showers. You may only be allowed to take sponge baths for bathing. °General Instructions °· Take over-the-counter and prescription medicines only as told by your health care provider. °· Do not drive or operate heavy machinery while taking prescription pain medicine. °· Return to your normal diet as told by your health care  provider. °· Do not lift anything that is heavier than 10 lb (4.5 kg). °· Do not play contact sports for one week or until your health care provider approves. °SEEK MEDICAL CARE IF:  °· You have redness, swelling, or pain at the site of your incision. °· You have fluid, blood, or pus coming from your incision. °· You notice a bad smell coming from your incision area. °· Your surgical incisions break open. °· You have a fever. °SEEK IMMEDIATE MEDICAL CARE IF: °· You develop a rash. °· You have difficulty breathing. °· You have chest pain. °· You have increasing pain in your shoulders (shoulder strap areas). °· You faint or have dizzy episodes while you are standing. °· You have severe pain in your abdomen. °· You have nausea or vomiting that lasts for more than one day. °  °This information is not intended to replace advice given to you by your health care provider. Make sure you discuss any questions you have with your health care provider. °  °Document Released: 03/09/2005 Document Revised: 11/28/2014 Document Reviewed: 10/19/2012 °Elsevier Interactive Patient Education ©2016 [employer] ° °

## 2015-09-02 NOTE — Anesthesia Preprocedure Evaluation (Signed)
Anesthesia Evaluation  Patient identified by MRN, date of birth, ID band Patient awake    Reviewed: Allergy & Precautions, NPO status , Patient's Chart, lab work & pertinent test results  Airway Mallampati: I  TM Distance: >3 FB     Dental  (+) Teeth Intact, Dental Advisory Given   Pulmonary Current Smoker,    breath sounds clear to auscultation       Cardiovascular negative cardio ROS   Rhythm:Regular Rate:Normal     Neuro/Psych  Headaches,    GI/Hepatic GERD  ,  Endo/Other    Renal/GU      Musculoskeletal   Abdominal   Peds  Hematology   Anesthesia Other Findings   Reproductive/Obstetrics                             Anesthesia Physical Anesthesia Plan  ASA: II  Anesthesia Plan: General   Post-op Pain Management:    Induction: Intravenous, Rapid sequence and Cricoid pressure planned  Airway Management Planned: Oral ETT  Additional Equipment:   Intra-op Plan:   Post-operative Plan: Extubation in OR  Informed Consent: I have reviewed the patients History and Physical, chart, labs and discussed the procedure including the risks, benefits and alternatives for the proposed anesthesia with the patient or authorized representative who has indicated his/her understanding and acceptance.     Plan Discussed with:   Anesthesia Plan Comments:         Anesthesia Quick Evaluation

## 2015-09-03 ENCOUNTER — Encounter (HOSPITAL_COMMUNITY): Payer: Self-pay | Admitting: General Surgery

## 2017-02-26 ENCOUNTER — Emergency Department (HOSPITAL_COMMUNITY)
Admission: EM | Admit: 2017-02-26 | Discharge: 2017-02-27 | Disposition: A | Discharge status: Home / Self Care | Payer: Self-pay | Attending: Emergency Medicine | Admitting: Emergency Medicine

## 2017-02-26 ENCOUNTER — Emergency Department (HOSPITAL_COMMUNITY): Payer: Self-pay

## 2017-02-26 ENCOUNTER — Encounter (HOSPITAL_COMMUNITY): Payer: Self-pay | Admitting: *Deleted

## 2017-02-26 ENCOUNTER — Other Ambulatory Visit: Payer: Self-pay

## 2017-02-26 DIAGNOSIS — F17210 Nicotine dependence, cigarettes, uncomplicated: Secondary | ICD-10-CM | POA: Insufficient documentation

## 2017-02-26 DIAGNOSIS — R102 Pelvic and perineal pain: Secondary | ICD-10-CM | POA: Insufficient documentation

## 2017-02-26 LAB — URINALYSIS, ROUTINE W REFLEX MICROSCOPIC
Bilirubin Urine: NEGATIVE
Glucose, UA: NEGATIVE mg/dL
Hgb urine dipstick: NEGATIVE
Ketones, ur: NEGATIVE mg/dL
Leukocytes, UA: NEGATIVE
Nitrite: NEGATIVE
Protein, ur: NEGATIVE mg/dL
Specific Gravity, Urine: 1.015 (ref 1.005–1.030)
pH: 6 (ref 5.0–8.0)

## 2017-02-26 LAB — PREGNANCY, URINE: Preg Test, Ur: NEGATIVE

## 2017-02-26 LAB — CBC
HCT: 40 % (ref 36.0–46.0)
Hemoglobin: 13.1 g/dL (ref 12.0–15.0)
MCH: 31.3 pg (ref 26.0–34.0)
MCHC: 32.8 g/dL (ref 30.0–36.0)
MCV: 95.7 fL (ref 78.0–100.0)
Platelets: 254 10*3/uL (ref 150–400)
RBC: 4.18 MIL/uL (ref 3.87–5.11)
RDW: 13.2 % (ref 11.5–15.5)
WBC: 8.3 10*3/uL (ref 4.0–10.5)

## 2017-02-26 LAB — COMPREHENSIVE METABOLIC PANEL
ALT: 9 U/L — ABNORMAL LOW (ref 14–54)
AST: 17 U/L (ref 15–41)
Albumin: 4.5 g/dL (ref 3.5–5.0)
Alkaline Phosphatase: 57 U/L (ref 38–126)
Anion gap: 7 (ref 5–15)
BUN: 10 mg/dL (ref 6–20)
CO2: 25 mmol/L (ref 22–32)
Calcium: 9.2 mg/dL (ref 8.9–10.3)
Chloride: 107 mmol/L (ref 101–111)
Creatinine, Ser: 0.52 mg/dL (ref 0.44–1.00)
GFR calc Af Amer: >60 mL/min (ref >60)
GFR calc non Af Amer: >60 mL/min (ref >60)
Glucose, Bld: 82 mg/dL (ref 65–99)
Potassium: 3.7 mmol/L (ref 3.5–5.1)
Sodium: 139 mmol/L (ref 135–145)
Total Bilirubin: 0.6 mg/dL (ref 0.3–1.2)
Total Protein: 7.8 g/dL (ref 6.5–8.1)

## 2017-02-26 LAB — I-STAT BETA HCG BLOOD, ED (MC, WL, AP ONLY): I-stat hCG, quantitative: <5 m[IU]/mL (ref <5)

## 2017-02-26 LAB — LIPASE, BLOOD: Lipase: 33 U/L (ref 11–51)

## 2017-02-26 MED ORDER — IOPAMIDOL (ISOVUE-300) INJECTION 61%
100.0000 mL | Freq: Once | INTRAVENOUS | Status: AC | PRN
  Administered 2017-02-26: 100 mL via INTRAVENOUS

## 2017-02-26 MED ORDER — ONDANSETRON HCL 4 MG/2ML IJ SOLN
4.0000 mg | Freq: Once | INTRAMUSCULAR | Status: AC
  Administered 2017-02-26: 4 mg via INTRAVENOUS
  Filled 2017-02-26: qty 2

## 2017-02-26 MED ORDER — MORPHINE SULFATE (PF) 4 MG/ML IV SOLN
4.0000 mg | Freq: Once | INTRAVENOUS | Status: AC
  Administered 2017-02-26: 4 mg via INTRAVENOUS
  Filled 2017-02-26: qty 1

## 2017-02-26 NOTE — ED Provider Notes (Signed)
[facility] EMERGENCY DEPARTMENT Provider Note   CSN: [id number] Arrival date & time: 02/26/17  2203     History   Chief Complaint Chief Complaint  Patient presents with  . Abdominal Pain    HPI Cassidy Braun is a 31 y.o. female.  HPI   Cassidy Braun is a 31 y.o. female who presents to the Emergency Department complaining of 2 week history of abdominal "bloating and cramps" symptoms associated with intermittent nausea and vomiting.  She describes a worsening pain to her lower abdomen today with pain radiating to her back and one episode of vomiting today while at work.  States earlier the pain was so severe that she "blacked out" for an unknown period of time.  Denies history of seizures. also admits to dysmenorrhea.  Symptoms are recurrent.  she denies fever, dysuria, vaginal discharge and recent abdominal surgeries.  symptoms are not affected by food intake.     Past Medical History:  Diagnosis Date  . Dysmenorrhea 07/09/2015  . Frequent headaches    lipoma tumor on brain  . Lipoma of head   . Nausea and vomiting 07/09/2015  . Pelvic pressure in female 07/09/2015  . Recurrent boils   . RUQ pain 07/09/2015    Patient Active Problem List   Diagnosis Date Noted  . Dysmenorrhea 07/09/2015  . RUQ pain 07/09/2015  . Pelvic pressure in female 07/09/2015  . Nausea and vomiting 07/09/2015    Past Surgical History:  Procedure Laterality Date  . APPENDECTOMY    . CHOLECYSTECTOMY N/A 09/02/2015   Procedure: LAPAROSCOPIC CHOLECYSTECTOMY;  Surgeon: Aviva Signs, MD;  Location: [facility];  Service: General;  Laterality: N/A;  . INDUCED ABORTION  2005    OB History    No data available       Home Medications    Prior to Admission medications   Medication Sig Start Date End Date Taking? Authorizing Provider  acetaminophen (TYLENOL) 500 MG tablet Take 1,500 mg by mouth 3 (three) times daily as needed for moderate pain.     [provider]    diphenhydramine-acetaminophen (TYLENOL PM) 25-500 MG TABS tablet Take 3 tablets by mouth at bedtime as needed (sleep/pain).     [provider]  ibuprofen (ADVIL,MOTRIN) 800 MG tablet Take 1 tablet (800 mg total) by mouth every 8 (eight) hours as needed. 07/09/15   Estill Dooms, NP  oxyCODONE-acetaminophen (PERCOCET) 7.5-325 MG tablet Take 1-2 tablets by mouth every 4 (four) hours as needed. 09/02/15   Aviva Signs, MD    Family History Family History  Problem Relation Age of Onset  . Congestive Heart Failure Mother   . Asthma Mother   . Diabetes Mother   . Hypertension Mother   . COPD Mother   . Diabetes Father   . Hypertension Father   . Stroke Father   . Fibroids Sister   . Cirrhosis Maternal Grandmother   . Other Sister        ovarian cysts    Social History Social History   Tobacco Use  . Smoking status: Current Every Day Smoker    Packs/day: 0.25    Years: 15.00    Pack years: 3.75    Types: Cigarettes  . Smokeless tobacco: Never Used  . Tobacco comment: smokes 3 cig daily  Substance Use Topics  . Alcohol use: Yes    Comment: occ  . Drug use: No     Allergies   Patient has no known allergies.   Review of  Systems Review of Systems  Constitutional: Negative for appetite change, chills and fever.  HENT: Negative for sore throat.   Respiratory: Negative for shortness of breath.   Cardiovascular: Negative for chest pain.  Gastrointestinal: Positive for abdominal distention, abdominal pain, nausea and vomiting. Negative for blood in stool and diarrhea.  Genitourinary: Negative for decreased urine volume, difficulty urinating, dysuria, flank pain, hematuria, vaginal bleeding and vaginal discharge.  Musculoskeletal: Negative for back pain.  Skin: Negative for color change and rash.  Neurological: Negative for dizziness, weakness and numbness.  Hematological: Negative for adenopathy.  All other systems reviewed and are negative.    Physical  Exam Updated Vital Signs BP (!) 125/100 (BP Location: Left Arm)   Pulse (!) 59   Temp 99 F (37.2 C) (Oral)   Resp 20   Ht 5\' 1"  (1.549 m)   Wt 71.2 kg (157 lb)   LMP 02/11/2017   SpO2 100%   BMI 29.66 kg/m   Physical Exam  Constitutional: She is oriented to person, place, and time. She appears well-developed and well-nourished.  Pt is uncomfortable appearing  HENT:  Head: Atraumatic.  Mouth/Throat: Oropharynx is clear and moist.  Eyes: Conjunctivae are normal.  Cardiovascular: Normal rate, regular rhythm and intact distal pulses.  No murmur heard. Pulmonary/Chest: Effort normal and breath sounds normal. No respiratory distress.  Abdominal: Soft. Normal appearance. She exhibits no mass. There is no hepatosplenomegaly. There is tenderness in the epigastric area and suprapubic area. There is no rebound, no guarding and no tenderness at McBurney's point.  Diffuse ttp of the epigastric and mid lower abdomen.  No guarding or rebound tenderness.  Abdomen does not appear distended  Genitourinary: Uterus is not enlarged. Cervix exhibits no motion tenderness and no friability. Right adnexum displays tenderness. Right adnexum displays no mass. Left adnexum displays tenderness. Left adnexum displays no mass. No bleeding in the vagina. No vaginal discharge found.  Genitourinary Comments: Bilateral adnexal tenderness on bimanual exam, no masses.  Mild CMT.  No significant vaginal d/c.    Musculoskeletal: Normal range of motion.  Neurological: She is alert and oriented to person, place, and time.  Skin: Skin is warm. No rash noted.  Nursing note and vitals reviewed.    ED Treatments / Results  Labs (all labs ordered are listed, but only abnormal results are displayed) Labs Reviewed  COMPREHENSIVE METABOLIC PANEL - Abnormal; Notable for the following components:      Result Value   ALT 9 (*)    All other components within normal limits  URINALYSIS, ROUTINE W REFLEX MICROSCOPIC - Abnormal;  Notable for the following components:   APPearance HAZY (*)    All other components within normal limits  LIPASE, BLOOD  CBC  PREGNANCY, URINE  I-STAT BETA HCG BLOOD, ED ([facility], [facility], [facility] ONLY)    EKG  EKG Interpretation None       Radiology Ct Abdomen Pelvis W Contrast  Result Date: 02/26/2017 CLINICAL DATA:  31 year old female with abdominal distention, nausea vomiting. EXAM: CT ABDOMEN AND PELVIS WITH CONTRAST TECHNIQUE: Multidetector CT imaging of the abdomen and pelvis was performed using the standard protocol following bolus administration of intravenous contrast. CONTRAST:  142mL ISOVUE-300 IOPAMIDOL (ISOVUE-300) INJECTION 61% COMPARISON:  Abdominal ultrasound dated 07/11/2015 FINDINGS: Lower chest: The visualized lung bases are clear. No intra-abdominal free air. Trace free fluid may be present within the pelvis. Hepatobiliary: The liver is unremarkable. No intrahepatic biliary ductal dilatation. Cholecystectomy. No retained calcified stone in the central CBD. Pancreas: Unremarkable.  No pancreatic ductal dilatation or surrounding inflammatory changes. Spleen: Normal in size without focal abnormality. Adrenals/Urinary Tract: Adrenal glands are unremarkable. Kidneys are normal, without renal calculi, focal lesion, or hydronephrosis. Bladder is unremarkable. Stomach/Bowel: There is a small hiatal hernia. There is infolding with mild impression of the gastroesophageal junction. Areas of flow attenuation adjacent to the GE junction noted, related to juxtaposition of adjacent bowel wall. This finding is similar to the study of 2015. The small amount of edema in this region is less likely. There is no evidence of bowel obstruction or active inflammation. Multiple normal caliber fecalized loops of distal small bowel noted which may represent increased transit time or small intestinal bacterial overgrowth. Clinical correlation is recommended. Appendectomy. Vascular/Lymphatic: The abdominal aorta and IVC  appear unremarkable. No portal venous gas. There is no adenopathy. Reproductive: The uterus is anteverted. For flow heterogeneous area in the posterior myometrium may be artifactual or represent a small fibroid, scarring, or an area of adenomyosis. The ovaries are unremarkable. Other: Small fat containing umbilical hernia. Musculoskeletal: No acute or significant osseous findings. IMPRESSION: No acute intra-abdominal or pelvic pathology. No bowel obstruction or active inflammation. Small hiatal hernia. Electronically Signed   By: Anner Crete M.D.   On: 02/26/2017 23:45    Procedures Procedures (including critical care time)  Medications Ordered in ED Medications  morphine 4 MG/ML injection 4 mg (4 mg Intravenous Given 02/26/17 2304)  ondansetron (ZOFRAN) injection 4 mg (4 mg Intravenous Given 02/26/17 2304)  iopamidol (ISOVUE-300) 61 % injection 100 mL (100 mLs Intravenous Contrast Given 02/26/17 2320)  cefTRIAXone (ROCEPHIN) injection 250 mg (250 mg Intramuscular Given 02/27/17 0101)     Initial Impression / Assessment and Plan / ED Course  I have reviewed the triage vital signs and the nursing notes.  Pertinent labs & imaging results that were available during my care of the patient were reviewed by me and considered in my medical decision making (see chart for details).     Labs and CT abdomen and pelvis are reassuring.  Patient has history of similar abdominal pain of unclear etiology.  No concerning symptoms on bimanual exam for TOA.  Symptoms likely related to PID.  patient reports feeling better after pain medication, appears stable for discharge.  Patient treated here with IM Rocephin and written prescription for doxycycline, she agrees to close follow-up with GYN.  Return precautions discussed.  Final Clinical Impressions(s) / ED Diagnoses   Final diagnoses:  Pelvic pain in female    ED Discharge Orders    None       Kem Parkinson, PA-C 02/28/17 1346    Virgel Manifold, MD 02/28/17 1610

## 2017-02-26 NOTE — ED Triage Notes (Signed)
Pt says abdominal pain for the past 2 weeks, described as a bloating. Normal bowel movement today. Pt says vomited once today.

## 2017-02-26 NOTE — ED Notes (Signed)
Pt denies dsicharge

## 2017-02-27 LAB — WET PREP, GENITAL
Clue Cells Wet Prep HPF POC: NONE SEEN
SPERM: NONE SEEN
TRICH WET PREP: NONE SEEN
Yeast Wet Prep HPF POC: NONE SEEN

## 2017-02-27 MED ORDER — DOXYCYCLINE HYCLATE 100 MG PO CAPS: 100.0000 mg | ORAL_CAPSULE | Freq: Two times a day (BID) | ORAL | 0 refills | Status: DC

## 2017-02-27 MED ORDER — CEFTRIAXONE SODIUM 250 MG IJ SOLR
250.0000 mg | Freq: Once | INTRAMUSCULAR | Status: AC
  Administered 2017-02-27: 250 mg via INTRAMUSCULAR
  Filled 2017-02-27: qty 250

## 2017-02-27 MED ORDER — IBUPROFEN 600 MG PO TABS: 600.0000 mg | ORAL_TABLET | Freq: Four times a day (QID) | ORAL | 0 refills | Status: DC | PRN

## 2017-02-27 MED ORDER — PROMETHAZINE HCL 25 MG PO TABS: 25.0000 mg | ORAL_TABLET | Freq: Four times a day (QID) | ORAL | 0 refills | Status: DC | PRN

## 2017-02-27 MED ORDER — LIDOCAINE HCL (PF) 1 % IJ SOLN
INTRAMUSCULAR | Status: AC
  Filled 2017-02-27: qty 2

## 2017-02-27 NOTE — Discharge Instructions (Signed)
Take the antibiotic as directed.  Follow-up with the health dept for recheck.  Return here for any worsening symptoms.  You may also want to try taking a stool softener such as Colace to help with constipation.

## 2017-02-27 NOTE — ED Notes (Signed)
Pt ambulatory to waiting room. Pt verbalized understanding of discharge instructions.   

## 2017-03-02 LAB — GC/CHLAMYDIA PROBE AMP (~~LOC~~) NOT AT ARMC
Chlamydia: NEGATIVE
NEISSERIA GONORRHEA: NEGATIVE

## 2017-05-10 ENCOUNTER — Encounter (INDEPENDENT_AMBULATORY_CARE_PROVIDER_SITE_OTHER): Payer: Self-pay

## 2017-05-10 ENCOUNTER — Encounter: Payer: Self-pay | Admitting: Adult Health

## 2017-05-10 ENCOUNTER — Ambulatory Visit: Payer: BLUE CROSS/BLUE SHIELD | Admitting: Adult Health

## 2017-05-10 VITALS — BP 110/80 | HR 89 | Ht 62.0 in | Wt 154.0 lb

## 2017-05-10 DIAGNOSIS — Z3202 Encounter for pregnancy test, result negative: Secondary | ICD-10-CM | POA: Diagnosis not present

## 2017-05-10 DIAGNOSIS — R110 Nausea: Secondary | ICD-10-CM | POA: Diagnosis not present

## 2017-05-10 DIAGNOSIS — N926 Irregular menstruation, unspecified: Secondary | ICD-10-CM

## 2017-05-10 DIAGNOSIS — N644 Mastodynia: Secondary | ICD-10-CM

## 2017-05-10 LAB — POCT URINE PREGNANCY: Preg Test, Ur: NEGATIVE

## 2017-05-10 NOTE — Progress Notes (Signed)
Subjective:     Patient ID: Cassidy Braun, female   DOB: 08-30-1985, 32 y.o.   MRN: [id number]  HPI Cassidy Braun is a 32 year old black female in complaining of periods not being as regular, and breast tenderness and nausea with metallic taste.   Review of Systems Irregular periods Breast tenderness Nausea, with metallic taste in mouth Reviewed past medical,surgical, social and family history. Reviewed medications and allergies.     Objective:   Physical Exam BP 110/80 (BP Location: Left Arm, Patient Position: Sitting, Cuff Size: Normal)   Pulse 89   Ht 5\' 2"  (1.575 m)   Wt 154 lb (69.9 kg)   LMP 05/09/2017 (Exact Date)   BMI 28.17 kg/m  UPT negative. Skin warm and dry.Pelvic: external genitalia is normal in appearance no lesions, vagina: +period blood,urethra has no lesions or masses noted, cervix:smooth and bulbous, uterus: normal size, shape and contour, non tender, no masses felt, adnexa: no masses or tenderness noted. Bladder is non tender and no masses felt. Deferred breast exam til after menses, and will check labs before exam.     Assessment:     1. Irregular periods/menstrual cycles   2. Pregnancy examination or test, negative result   3. Soreness breast       Plan:     Check Prolactin and TSH level before next appt, do not manipulate breasts that day F/U in 3 weeks for breast exam

## 2017-05-10 NOTE — Patient Instructions (Signed)
Labs before visit in 3 weeks No not manipulate e breasts that day

## 2017-05-31 ENCOUNTER — Encounter: Payer: Self-pay | Admitting: Adult Health

## 2017-05-31 ENCOUNTER — Ambulatory Visit (INDEPENDENT_AMBULATORY_CARE_PROVIDER_SITE_OTHER): Payer: Self-pay | Admitting: Adult Health

## 2017-05-31 VITALS — BP 110/78 | HR 90 | Ht 62.0 in | Wt 158.0 lb

## 2017-05-31 DIAGNOSIS — N644 Mastodynia: Secondary | ICD-10-CM

## 2017-05-31 DIAGNOSIS — N6452 Nipple discharge: Secondary | ICD-10-CM

## 2017-05-31 NOTE — Progress Notes (Signed)
Subjective:     Patient ID: Cassidy Braun, female   DOB: 09-16-1985, 32 y.o.   MRN: [id number]  HPI [name] is a 32 year old black female in for breast exam with labs before exam.She says she has had pain for about 2 months now, with L>R and leaks clear.   Review of Systems Pain in both breasts for 2 months now and they are leaking clear discharge Reviewed past medical,surgical, social and family history. Reviewed medications and allergies.     Objective:   Physical Exam BP 110/78 (BP Location: Left Arm, Patient Position: Sitting, Cuff Size: Normal)   Pulse 90   Ht 5\' 2"  (1.575 m)   Wt 158 lb (71.7 kg)   LMP 05/09/2017 (Exact Date)   BMI 28.90 kg/m     Skin warm and dry,  Breasts:no dominate palpable mass, retraction or nipple discharge, has small epidermal cyst on left at 7 0'clock and inner lower quadrant of left breast is the most tender on exam with irregularities felt,but she says both breasts tender. Had TSH and Prolactin drawn before exam and will schedule diagnostic mammogram   Assessment:     1. Pain in breast   2. Breast discharge       Plan:     TSH and Prolactin drawn before exam,will talk when results back  Diagnostic mammogram and right and left [place] if needed, schedule 3/19 at 3:20 pm at [facility] F/U prn

## 2017-06-01 LAB — TSH: TSH: 1.37 u[IU]/mL (ref 0.450–4.500)

## 2017-06-01 LAB — PROLACTIN: Prolactin: 15.9 ng/mL (ref 4.8–23.3)

## 2017-06-08 ENCOUNTER — Encounter (HOSPITAL_COMMUNITY): Payer: Medicaid Other

## 2017-08-17 ENCOUNTER — Telehealth: Payer: Self-pay | Admitting: *Deleted

## 2017-08-17 NOTE — Telephone Encounter (Signed)
Pt called requesting lab results from March 2019. DOB verified. Informed pt that TSH and prolactin were both normal.

## 2017-08-27 ENCOUNTER — Ambulatory Visit (INDEPENDENT_AMBULATORY_CARE_PROVIDER_SITE_OTHER): Payer: Medicaid Other | Admitting: Obstetrics & Gynecology

## 2017-08-27 ENCOUNTER — Encounter: Payer: Self-pay | Admitting: Obstetrics & Gynecology

## 2017-08-27 VITALS — BP 133/84 | HR 81 | Ht 61.0 in | Wt 160.0 lb

## 2017-08-27 DIAGNOSIS — F458 Other somatoform disorders: Secondary | ICD-10-CM | POA: Diagnosis not present

## 2017-08-27 DIAGNOSIS — Z3202 Encounter for pregnancy test, result negative: Secondary | ICD-10-CM

## 2017-08-27 LAB — POCT URINE PREGNANCY: Preg Test, Ur: NEGATIVE

## 2017-08-28 LAB — BETA HCG QUANT (REF LAB)

## 2017-08-31 ENCOUNTER — Telehealth: Payer: Self-pay | Admitting: Obstetrics & Gynecology

## 2017-08-31 NOTE — Telephone Encounter (Signed)
Called and lmom to call and give her her lab results, !!

## 2017-08-31 NOTE — Telephone Encounter (Signed)
LMOVM returning call 

## 2017-09-02 ENCOUNTER — Telehealth: Payer: Self-pay | Admitting: *Deleted

## 2017-09-02 NOTE — Telephone Encounter (Signed)
Informed patient HCG was <1. Not pregnant.

## 2017-09-12 ENCOUNTER — Encounter: Payer: Self-pay | Admitting: Obstetrics & Gynecology

## 2017-09-12 NOTE — Progress Notes (Signed)
Chief Complaint  Patient presents with  . periods only lasting 2 days    tender breasts; feels tired      32 y.o. [id number] Patient's last menstrual period was 07/30/2017. The current method of family planning is none.  Outpatient Encounter Medications as of 08/27/2017  Medication Sig  . diphenhydrAMINE HCl (BENADRYL PO) Take by mouth as needed.  . [DISCONTINUED] ibuprofen (ADVIL,MOTRIN) 600 MG tablet Take 1 tablet (600 mg total) by mouth every 6 (six) hours as needed. (Patient not taking: Reported on 05/31/2017)   No facility-administered encounter medications on file as of 08/27/2017.     Subjective Cassidy Braun is in concerned she is pregnant Recently had work up for tender breasts which was negative, 05/2017 Past Medical History:  Diagnosis Date  . Dysmenorrhea 07/09/2015  . Frequent headaches    lipoma tumor on brain  . Lipoma of head   . Nausea and vomiting 07/09/2015  . Pelvic pressure in female 07/09/2015  . Recurrent boils   . RUQ pain 07/09/2015    Past Surgical History:  Procedure Laterality Date  . APPENDECTOMY    . CHOLECYSTECTOMY N/A 09/02/2015   Procedure: LAPAROSCOPIC CHOLECYSTECTOMY;  Surgeon: Aviva Signs, MD;  Location: [facility];  Service: General;  Laterality: N/A;  . INDUCED ABORTION  2005    OB History    Gravida  5   Para  3   Term  3   Preterm      AB  2   Living  3     SAB  1   TAB      Ectopic      Multiple      Live Births  3           No Known Allergies  Social History   Socioeconomic History  . Marital status: Single    Spouse name: Not on file  . Number of children: Not on file  . Years of education: Not on file  . Highest education level: Not on file  Occupational History  . Not on file  Social Needs  . Financial resource strain: Not on file  . Food insecurity:    Worry: Not on file    Inability: Not on file  . Transportation needs:    Medical: Not on file    Non-medical: Not on file  Tobacco Use    . Smoking status: Current Every Day Smoker    Packs/day: 0.00    Years: 15.00    Pack years: 0.00    Types: Cigarettes  . Smokeless tobacco: Never Used  . Tobacco comment: smokes 4-5  cig daily  Substance and Sexual Activity  . Alcohol use: Yes    Comment: occ  . Drug use: No  . Sexual activity: Yes    Birth control/protection: None  Lifestyle  . Physical activity:    Days per week: Not on file    Minutes per session: Not on file  . Stress: Not on file  Relationships  . Social connections:    Talks on phone: Not on file    Gets together: Not on file    Attends religious service: Not on file    Active member of club or organization: Not on file    Attends meetings of clubs or organizations: Not on file    Relationship status: Not on file  Other Topics Concern  . Not on file  Social History Narrative  . Not on file  Family History  Problem Relation Age of Onset  . Congestive Heart Failure Mother   . Asthma Mother   . Diabetes Mother   . Hypertension Mother   . COPD Mother   . Diabetes Father   . Hypertension Father   . Stroke Father   . Fibroids Sister   . Cirrhosis Maternal Grandmother   . Other Sister        ovarian cysts    Medications:       Current Outpatient Medications:  .  diphenhydrAMINE HCl (BENADRYL PO), Take by mouth as needed., Disp: , Rfl:   Objective Blood pressure 133/84, pulse 81, height 5\' 1"  (1.549 m), weight 160 lb (72.6 kg), last menstrual period 07/30/2017.  Gen WDWN NAD  Pertinent ROS No burning with urination, frequency or urgency No nausea, vomiting or diarrhea Nor fever chills or other constitutional symptoms   Labs or studies Reviewed from 05/2017    Impression Diagnoses this Encounter::   ICD-10-CM   1. Pseudocyesis F45.8 Beta hCG quant (ref lab)  2. Pregnancy examination or test, negative result Z32.02 POCT urine pregnancy    Established relevant diagnosis(es):   Plan/Recommendations: No orders of the defined  types were placed in this encounter.   Labs or Scans Ordered: Orders Placed This Encounter  Procedures  . Beta hCG quant (ref lab)  . POCT urine pregnancy    Management:: Pt was concerned she is pregnant, she has some pseudocyesis symptoms and requests a serum pregnncy test which is performed and I will notify her of the reuslt   Follow up No follow-ups on file.        Face to face time:  10 minutes  Greater than 50% of the visit time was spent in counseling and coordination of care with the patient.  The summary and outline of the counseling and care coordination is summarized in the note above.   All questions were answered.

## 2017-12-23 IMAGING — CT CT ABD-PELV W/ CM
2 of 3 series · 16 of 46 positions shown, 18 images · IV contrast (Isovue)
Comparison: Abdominal ultrasound dated 07/11/2015

CLINICAL DATA: 31-year-old female with abdominal distention, nausea
vomiting.

EXAM:
CT ABDOMEN AND PELVIS WITH CONTRAST
TECHNIQUE: Multidetector CT imaging of the abdomen and pelvis was performed
using the standard protocol following bolus administration of
intravenous contrast.
CONTRAST:  100mL NGFVOR-Q00 IOPAMIDOL (NGFVOR-Q00) INJECTION 61%

[Series 2: axial st · axial · 0.76mm/px · z∈[-197,+163]mm · 13 of 84 slices shown, 15 images]
[im 6/84  soft-tissue]
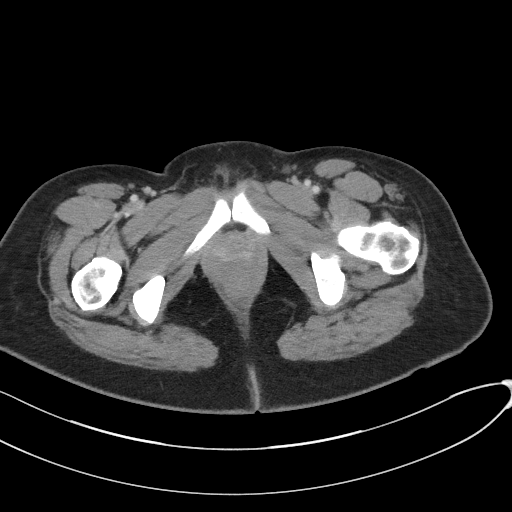
[im 6/84  bone]
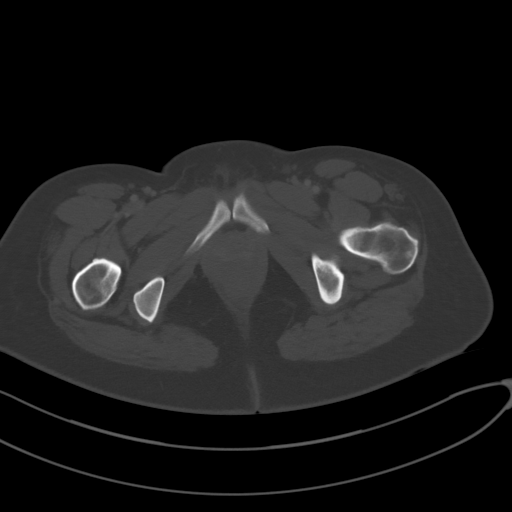
[im 11/84  soft-tissue]
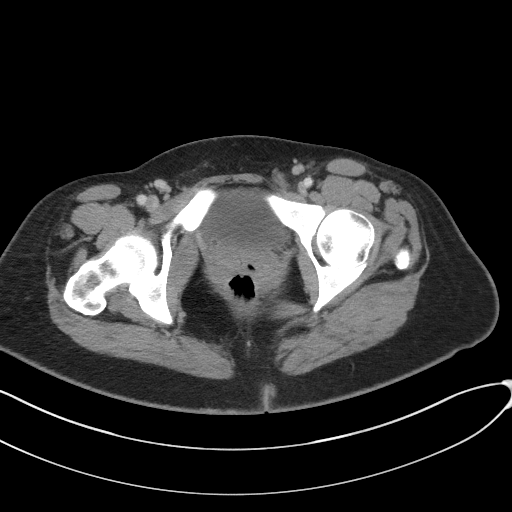
[im 17/84  soft-tissue]
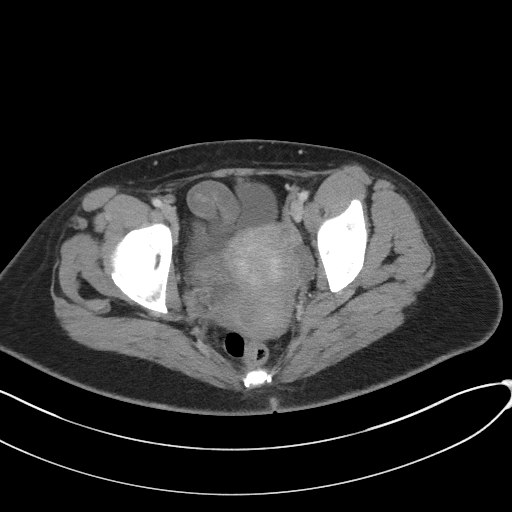
[im 25/84  soft-tissue]
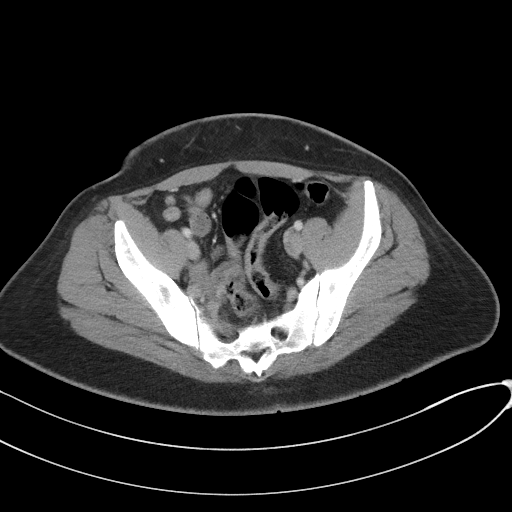
[im 30/84  soft-tissue]
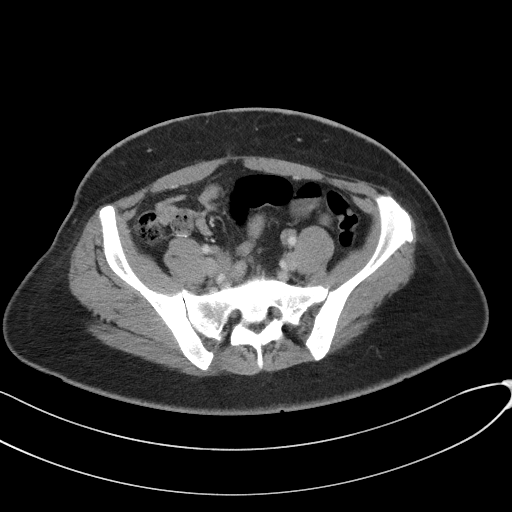
[im 35/84  soft-tissue]
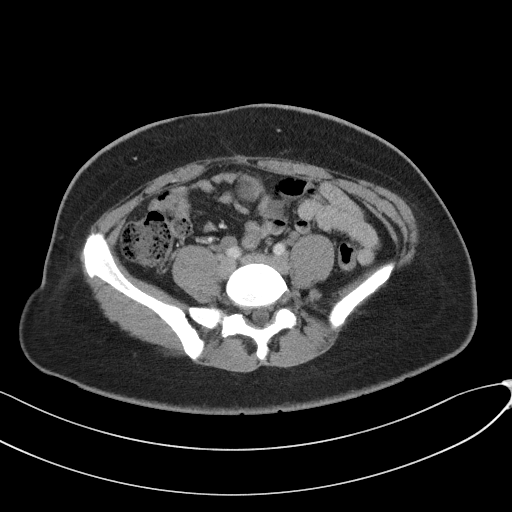
[im 43/84  soft-tissue]
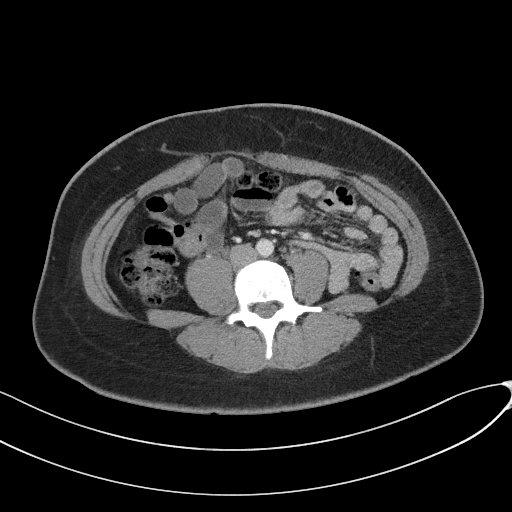
[im 49/84  soft-tissue]
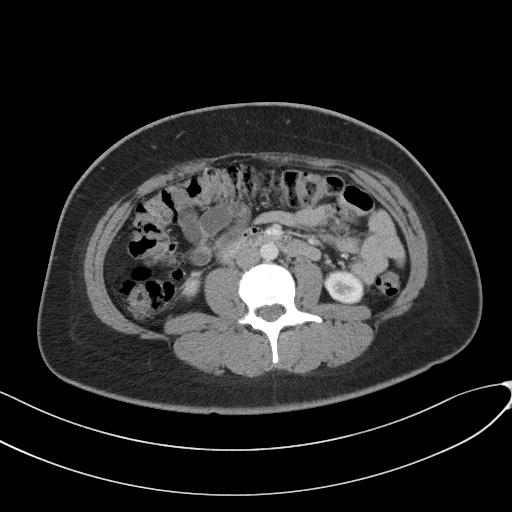
[im 54/84  soft-tissue]
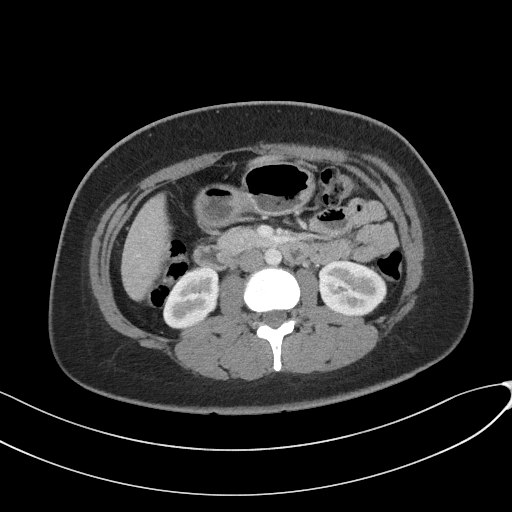
[im 54/84  bone]
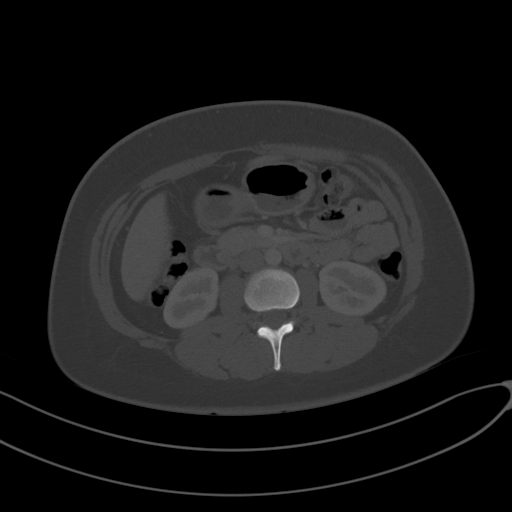
[im 59/84  soft-tissue]
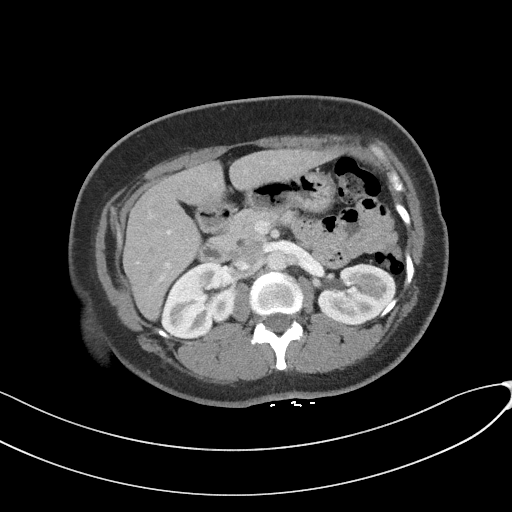
[im 67/84  soft-tissue]
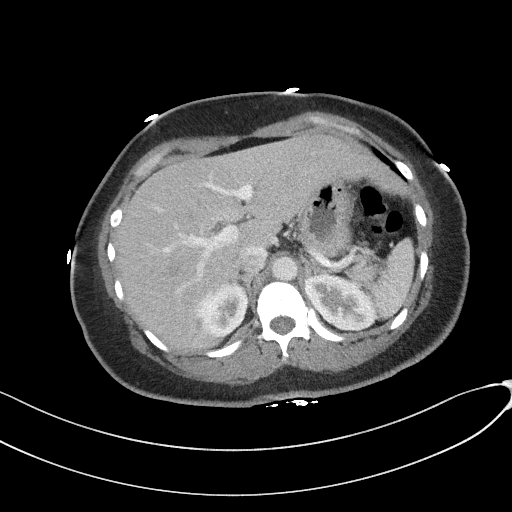
[im 73/84  soft-tissue]
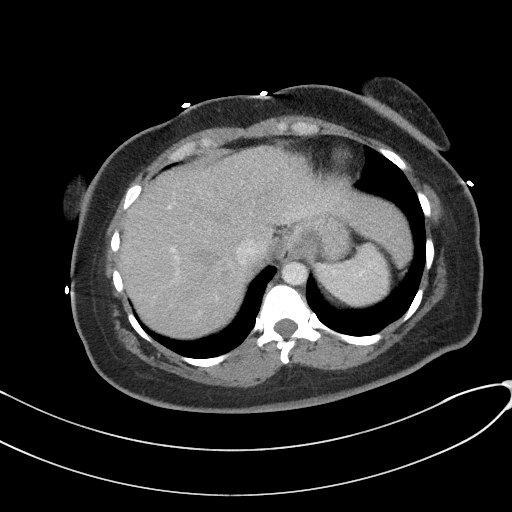
[im 78/84  soft-tissue]
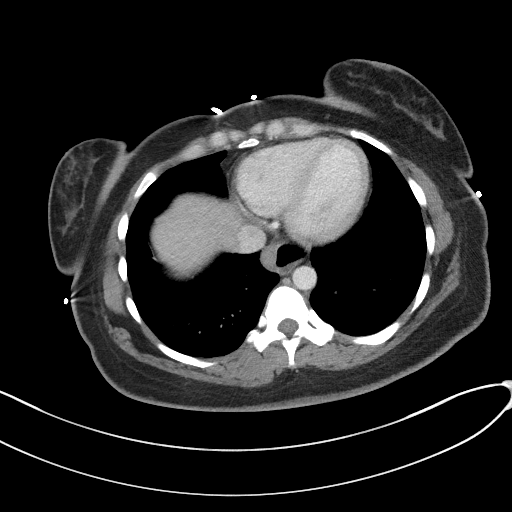

[Series 5: coronal st · coronal · 0.73mm/px · 3 of 79 slices shown]
[im 27/79  soft-tissue]
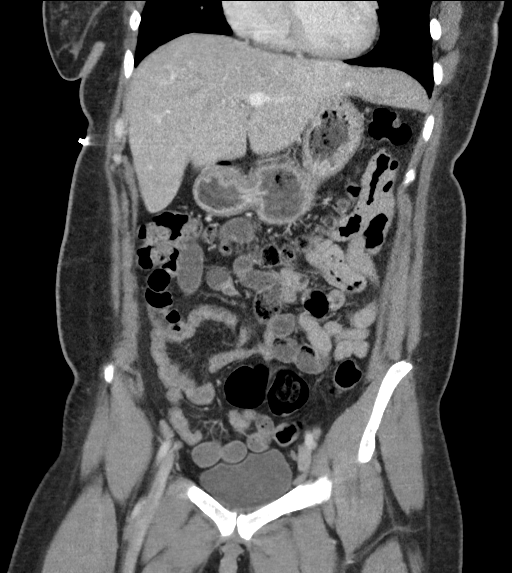
[im 35/79  soft-tissue]
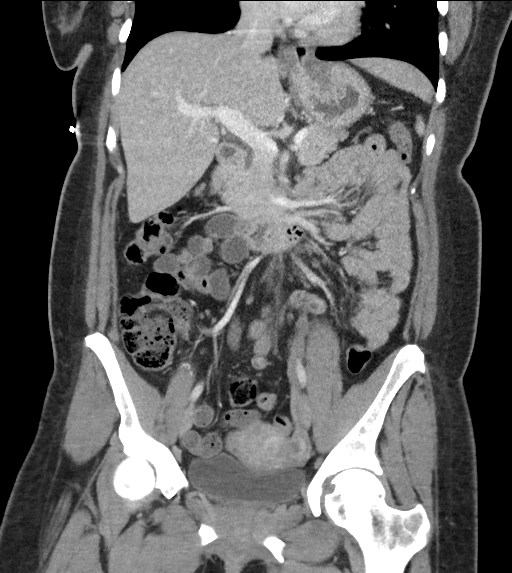
[im 44/79  soft-tissue]
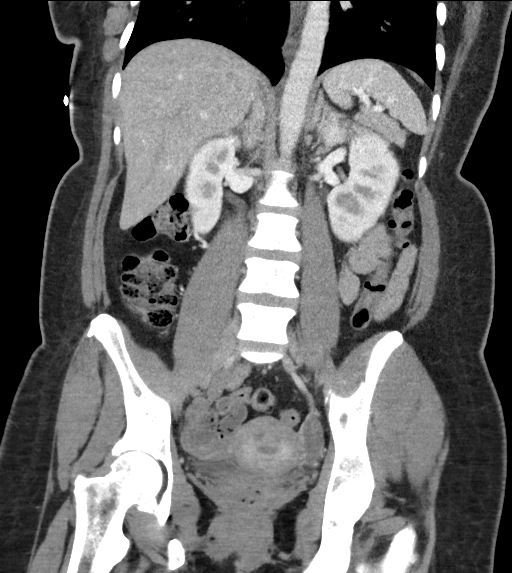

[16 of 46 positions shown; findings below may reference images not displayed]

FINDINGS: Lower chest: The visualized lung bases are clear.

No intra-abdominal free air. Trace free fluid may be present within
the pelvis.

Hepatobiliary: The liver is unremarkable. No intrahepatic biliary
ductal dilatation. Cholecystectomy. No retained calcified stone in
the central CBD.

Pancreas: Unremarkable. No pancreatic ductal dilatation or
surrounding inflammatory changes.

Spleen: Normal in size without focal abnormality.

Adrenals/Urinary Tract: Adrenal glands are unremarkable. Kidneys are
normal, without renal calculi, focal lesion, or hydronephrosis.
Bladder is unremarkable.

Stomach/Bowel: There is a small hiatal hernia. There is infolding
with mild impression of the gastroesophageal junction. Areas of flow
attenuation adjacent to the GE junction noted, related to
juxtaposition of adjacent bowel wall. This finding is similar to the
study of 8579. The small amount of edema in this region is less
likely. There is no evidence of bowel obstruction or active
inflammation. Multiple normal caliber fecalized loops of distal
small bowel noted which may represent increased transit time or
small intestinal bacterial overgrowth. Clinical correlation is
recommended. Appendectomy.

Vascular/Lymphatic: The abdominal aorta and IVC appear unremarkable.
No portal venous gas. There is no adenopathy.

Reproductive: The uterus is anteverted. For flow heterogeneous area
in the posterior myometrium may be artifactual or represent a small
fibroid, scarring, or an area of adenomyosis. The ovaries are
unremarkable.

Other: Small fat containing umbilical hernia.

Musculoskeletal: No acute or significant osseous findings.
IMPRESSION: No acute intra-abdominal or pelvic pathology. No bowel obstruction
or active inflammation.

Small hiatal hernia.

## 2018-03-23 NOTE — L&D Delivery Note (Signed)
OB/GYN Faculty Practice Delivery Note  Cassidy Braun is a 33 y.o. [id number] s/p VD at [redacted]w[redacted]d. She was admitted for IOL for severe Pre-E.   ROM: 1h 24m with clear fluid GBS Status: unknown; received adequate ppx   Maximum Maternal Temperature: 99.40F  Labor Progress: . Patient presented to L&D for IOL for severe Pre-E. Initial SVE: 0.5/thick/-3. Patient received Cytotec, Cooks catheter, AROM and Pitocin. She received Epidural. She then progressed to complete.   Delivery Date/Time: 11/17 @ 0447 Delivery: Called to room due to heart rate in 50's. Head delivered in ROA position. No nuchal cord present. Shoulder and body delivered in usual fashion. Infant with spontaneous cry, placed on mother's abdomen, dried and stimulated. Cord clamped x 2 after 1-minute delay, and cut by friend of mother. Cord blood drawn. Placenta delivered spontaneously with gentle cord traction. Fundus firm with massage and Pitocin. Labia, perineum, vagina, and cervix inspected inspected with no lacerations. Baby to NICU due to weight. Baby Weight: 1920g  Placenta: Sent to L&D Complications: None Lacerations: None EBL: 150 Analgesia: Epidural   Infant:  APGAR (1 MIN): 8 APGAR (5 MINS): 9  APGAR (10 MINS):    Barrington Ellison, MD [facility] Family Medicine Fellow, [facility] for [facility] 02/07/2019, 6:05 AM

## 2018-07-06 ENCOUNTER — Encounter: Payer: Self-pay | Admitting: Adult Health

## 2018-07-06 ENCOUNTER — Other Ambulatory Visit: Payer: Self-pay

## 2018-07-06 ENCOUNTER — Ambulatory Visit (INDEPENDENT_AMBULATORY_CARE_PROVIDER_SITE_OTHER): Payer: Self-pay | Admitting: Adult Health

## 2018-07-06 DIAGNOSIS — Z3A01 Less than 8 weeks gestation of pregnancy: Secondary | ICD-10-CM

## 2018-07-06 DIAGNOSIS — O3680X0 Pregnancy with inconclusive fetal viability, not applicable or unspecified: Secondary | ICD-10-CM | POA: Insufficient documentation

## 2018-07-06 DIAGNOSIS — Z3201 Encounter for pregnancy test, result positive: Secondary | ICD-10-CM

## 2018-07-06 MED ORDER — PROMETHAZINE HCL 25 MG PO TABS: 25.0000 mg | ORAL_TABLET | Freq: Four times a day (QID) | ORAL | 1 refills | Status: DC | PRN

## 2018-07-06 MED ORDER — PRENATAL PLUS 27-1 MG PO TABS: 1.0000 | ORAL_TABLET | Freq: Every day | ORAL | 12 refills | Status: DC

## 2018-07-06 NOTE — Progress Notes (Signed)
Patient ID: Cassidy Braun, female   DOB: 07-23-1985, 33 y.o.   MRN: [id number]   TELEHEALTH VIRTUAL GYNECOLOGY VISIT ENCOUNTER NOTE  I connected with Cassidy Braun on 07/06/18 at  3:00 PM EDT by telephone at home and verified that I am speaking with the correct person using two identifiers.   I discussed the limitations, risks, security and privacy concerns of performing an evaluation and management service by telephone and the availability of in person appointments. I also discussed with the patient that there may be a patient responsible charge related to this service. The patient expressed understanding and agreed to proceed.   History:  Cassidy Braun is a 33 y.o. [phone] female being evaluated today for having missed a period and had 1+HPT,so about 5=5 weeks by LMP with EDD 03/03/2019.Marland Kitchen She denies any abnormal vaginal discharge, bleeding, pelvic pain or other concerns.  She has had nausea and vomiting and some cramping.  PCP is [facility]   Past Medical History:  Diagnosis Date  . Dysmenorrhea 07/09/2015  . Frequent headaches    lipoma tumor on brain  . Lipoma of head   . Nausea and vomiting 07/09/2015  . Pelvic pressure in female 07/09/2015  . Recurrent boils   . RUQ pain 07/09/2015   Past Surgical History:  Procedure Laterality Date  . APPENDECTOMY    . CHOLECYSTECTOMY N/A 09/02/2015   Procedure: LAPAROSCOPIC CHOLECYSTECTOMY;  Surgeon: Aviva Signs, MD;  Location: [facility];  Service: General;  Laterality: N/A;  . INDUCED ABORTION  2005   The following portions of the patient's history were reviewed and updated as appropriate: allergies, current medications, past family history, past medical history, past social history, past surgical history and problem list.   Health Maintenance: NA  Review of Systems:  Pertinent items noted in HPI and remainder of comprehensive ROS otherwise negative.  Physical Exam:   General:  Alert, oriented and cooperative.   Mental Status: Normal  mood and affect perceived. Normal judgment and thought content.  Physical exam deferred due to nature of the encounter Fall risk is low PHQ 2 score 0.   Labs and Imaging No results found for this or any previous visit (from the past 336 hour(s)). No results found.    Assessment and Plan:     1. Positive pregnancy test   2. Less than [redacted] weeks gestation of pregnancy Meds ordered this encounter  Medications  . prenatal vitamin w/FE, FA (PRENATAL 1 + 1) 27-1 MG TABS tablet    Sig: Take 1 tablet by mouth daily at 12 noon.    Dispense:  30 each    Refill:  12    Order Specific Question:   Supervising Provider    Answer:   Elonda Husky, LUTHER H [[id number]]  . promethazine (PHENERGAN) 25 MG tablet    Sig: Take 1 tablet (25 mg total) by mouth every 6 (six) hours as needed for nausea or vomiting.    Dispense:  30 tablet    Refill:  1    Order Specific Question:   Supervising Provider    Answer:   Tania Ade H [[id number]]  -eat often   3. Encounter to determine fetal viability of pregnancy, single or unspecified fetus - US OB Comp Less 14 Wks; Future       I discussed the assessment and treatment plan with the patient. The patient was provided an opportunity to ask questions and all were answered. The patient agreed with the plan and demonstrated an understanding of the instructions.  The patient was advised to call back or seek an in-person evaluation/go to the ED if the symptoms worsen or if the condition fails to improve as anticipated.  I provided 9  minutes of non-face-to-face time during this encounter.   Derrek Monaco, NP Center for [employer], [place]

## 2018-07-12 ENCOUNTER — Telehealth: Payer: Self-pay | Admitting: Adult Health

## 2018-07-12 NOTE — Telephone Encounter (Signed)
Please call pt she left a message that she was needing a note for work

## 2018-07-12 NOTE — Telephone Encounter (Signed)
Pt states that she had morning sickness at work and her employer got nervous and said she needs a note stating that she is pregnant and that the nausea and vomiting is because of that. She denies any other symptoms. Advised patient that I will send message to a provider and she can check in my chart for the letter this afternoon/ tomorrow.

## 2018-07-13 NOTE — Telephone Encounter (Signed)
Will do note stating she is pregnant and having episodes of nausea and vomiting.

## 2018-08-03 ENCOUNTER — Ambulatory Visit (INDEPENDENT_AMBULATORY_CARE_PROVIDER_SITE_OTHER): Payer: Medicaid Other

## 2018-08-03 ENCOUNTER — Other Ambulatory Visit: Payer: Self-pay

## 2018-08-03 DIAGNOSIS — O3680X0 Pregnancy with inconclusive fetal viability, not applicable or unspecified: Secondary | ICD-10-CM

## 2018-08-03 DIAGNOSIS — Z3A09 9 weeks gestation of pregnancy: Secondary | ICD-10-CM

## 2018-08-03 NOTE — Progress Notes (Signed)
[place] 9+5 wks,single IUP w/ys,positive fht 158 bpm,crl 27.8 mm,normal left ovary,simple right corpus luteal cyst 2.5 x 2 x 2 cm

## 2018-08-11 ENCOUNTER — Inpatient Hospital Stay (HOSPITAL_COMMUNITY): Payer: Medicaid Other

## 2018-08-11 ENCOUNTER — Other Ambulatory Visit: Payer: Self-pay

## 2018-08-11 ENCOUNTER — Inpatient Hospital Stay (HOSPITAL_COMMUNITY)
Admission: AD | Admit: 2018-08-11 | Discharge: 2018-08-11 | Disposition: A | Discharge status: Home / Self Care | Payer: Medicaid Other | Attending: Family Medicine | Admitting: Family Medicine

## 2018-08-11 ENCOUNTER — Encounter (HOSPITAL_COMMUNITY): Payer: Self-pay | Admitting: *Deleted

## 2018-08-11 DIAGNOSIS — Z3A10 10 weeks gestation of pregnancy: Secondary | ICD-10-CM

## 2018-08-11 DIAGNOSIS — O210 Mild hyperemesis gravidarum: Secondary | ICD-10-CM | POA: Diagnosis present

## 2018-08-11 DIAGNOSIS — F1210 Cannabis abuse, uncomplicated: Secondary | ICD-10-CM

## 2018-08-11 DIAGNOSIS — O99611 Diseases of the digestive system complicating pregnancy, first trimester: Secondary | ICD-10-CM | POA: Diagnosis not present

## 2018-08-11 DIAGNOSIS — Z800 Family history of malignant neoplasm of digestive organs: Secondary | ICD-10-CM | POA: Insufficient documentation

## 2018-08-11 DIAGNOSIS — O99321 Drug use complicating pregnancy, first trimester: Secondary | ICD-10-CM | POA: Insufficient documentation

## 2018-08-11 DIAGNOSIS — F17210 Nicotine dependence, cigarettes, uncomplicated: Secondary | ICD-10-CM | POA: Diagnosis not present

## 2018-08-11 DIAGNOSIS — R112 Nausea with vomiting, unspecified: Secondary | ICD-10-CM

## 2018-08-11 DIAGNOSIS — O99331 Smoking (tobacco) complicating pregnancy, first trimester: Secondary | ICD-10-CM | POA: Diagnosis not present

## 2018-08-11 DIAGNOSIS — Z9049 Acquired absence of other specified parts of digestive tract: Secondary | ICD-10-CM | POA: Diagnosis not present

## 2018-08-11 DIAGNOSIS — Z833 Family history of diabetes mellitus: Secondary | ICD-10-CM | POA: Insufficient documentation

## 2018-08-11 DIAGNOSIS — K5900 Constipation, unspecified: Secondary | ICD-10-CM | POA: Insufficient documentation

## 2018-08-11 DIAGNOSIS — O9989 Other specified diseases and conditions complicating pregnancy, childbirth and the puerperium: Secondary | ICD-10-CM | POA: Diagnosis not present

## 2018-08-11 DIAGNOSIS — O26891 Other specified pregnancy related conditions, first trimester: Secondary | ICD-10-CM

## 2018-08-11 DIAGNOSIS — R1110 Vomiting, unspecified: Secondary | ICD-10-CM

## 2018-08-11 DIAGNOSIS — R109 Unspecified abdominal pain: Secondary | ICD-10-CM

## 2018-08-11 HISTORY — DX: Anemia, unspecified: D64.9

## 2018-08-11 LAB — COMPREHENSIVE METABOLIC PANEL
ALT: 19 U/L (ref 0–44)
AST: 17 U/L (ref 15–41)
Albumin: 3.7 g/dL (ref 3.5–5.0)
Alkaline Phosphatase: 50 U/L (ref 38–126)
Anion gap: 11 (ref 5–15)
BUN: 7 mg/dL (ref 6–20)
CO2: 23 mmol/L (ref 22–32)
Calcium: 9.5 mg/dL (ref 8.9–10.3)
Chloride: 104 mmol/L (ref 98–111)
Creatinine, Ser: 0.59 mg/dL (ref 0.44–1.00)
GFR calc Af Amer: >60 mL/min (ref >60)
GFR calc non Af Amer: >60 mL/min (ref >60)
Glucose, Bld: 74 mg/dL (ref 70–99)
Potassium: 3.8 mmol/L (ref 3.5–5.1)
Sodium: 138 mmol/L (ref 135–145)
Total Bilirubin: 0.6 mg/dL (ref 0.3–1.2)
Total Protein: 7.4 g/dL (ref 6.5–8.1)

## 2018-08-11 LAB — WET PREP, GENITAL
Sperm: NONE SEEN
Trich, Wet Prep: NONE SEEN
Yeast Wet Prep HPF POC: NONE SEEN

## 2018-08-11 LAB — CBC
HCT: 37.8 % (ref 36.0–46.0)
Hemoglobin: 13.2 g/dL (ref 12.0–15.0)
MCH: 32 pg (ref 26.0–34.0)
MCHC: 34.9 g/dL (ref 30.0–36.0)
MCV: 91.5 fL (ref 80.0–100.0)
Platelets: 238 10*3/uL (ref 150–400)
RBC: 4.13 MIL/uL (ref 3.87–5.11)
RDW: 11.3 % — ABNORMAL LOW (ref 11.5–15.5)
WBC: 10.8 10*3/uL — ABNORMAL HIGH (ref 4.0–10.5)
nRBC: 0 % (ref 0.0–0.2)

## 2018-08-11 LAB — URINALYSIS, ROUTINE W REFLEX MICROSCOPIC
Bilirubin Urine: NEGATIVE
Glucose, UA: NEGATIVE mg/dL
Ketones, ur: 80 mg/dL — AB
Leukocytes,Ua: NEGATIVE
Nitrite: NEGATIVE
Protein, ur: 30 mg/dL — AB
Specific Gravity, Urine: 1.031 — ABNORMAL HIGH (ref 1.005–1.030)
pH: 5 (ref 5.0–8.0)

## 2018-08-11 LAB — RAPID URINE DRUG SCREEN, HOSP PERFORMED
Amphetamines: NOT DETECTED
Barbiturates: NOT DETECTED
Benzodiazepines: NOT DETECTED
Cocaine: NOT DETECTED
Opiates: NOT DETECTED
Tetrahydrocannabinol: POSITIVE — AB

## 2018-08-11 LAB — PREGNANCY, URINE: Preg Test, Ur: POSITIVE — AB

## 2018-08-11 MED ORDER — FAMOTIDINE IN NACL 20-0.9 MG/50ML-% IV SOLN
20.0000 mg | Freq: Once | INTRAVENOUS | Status: AC
  Administered 2018-08-11: 19:00:00 20 mg via INTRAVENOUS
  Filled 2018-08-11: qty 50

## 2018-08-11 MED ORDER — LACTATED RINGERS IV BOLUS
500.0000 mL | Freq: Once | INTRAVENOUS | Status: AC
  Administered 2018-08-11: 19:00:00 via INTRAVENOUS

## 2018-08-11 MED ORDER — LACTATED RINGERS IV BOLUS
500.0000 mL | Freq: Once | INTRAVENOUS | Status: AC
  Administered 2018-08-11: 500 mL via INTRAVENOUS

## 2018-08-11 MED ORDER — CYCLOBENZAPRINE HCL 10 MG PO TABS
10.0000 mg | ORAL_TABLET | Freq: Once | ORAL | Status: AC
  Administered 2018-08-11: 10 mg via ORAL
  Filled 2018-08-11: qty 1

## 2018-08-11 MED ORDER — SODIUM CHLORIDE 0.9 % IV SOLN
8.0000 mg | INTRAVENOUS | Status: AC
  Administered 2018-08-11: 20:00:00 8 mg via INTRAVENOUS
  Filled 2018-08-11: qty 4

## 2018-08-11 MED ORDER — ONDANSETRON HCL 8 MG PO TABS: 8.0000 mg | ORAL_TABLET | Freq: Three times a day (TID) | ORAL | 1 refills | Status: DC | PRN

## 2018-08-11 NOTE — MAU Provider Note (Addendum)
Patient Cassidy Braun is a 33 y.o. [phone] at [redacted]w[redacted]d here with complaints of constant nausea and vomiting for the past two weeks. She also reports some abdominal pain that has been going on for a week; she had a scan at [facility] that showed an IUP. However her pain is increased this week. She also had some white discharge that was concerning. She denies vaginal bleeding, dysuria, back pain or other ob-gyn complaint.   History     CSN: [id number]  Arrival date and time: 08/11/18 1606   First Provider Initiated Contact with Patient 08/11/18 1825      Chief Complaint  Patient presents with  . Emesis  . Nausea   Emesis   This is a new problem. The current episode started in the past 7 days. The problem occurs 5 to 10 times per day. The problem has been unchanged. The emesis has an appearance of bile. There has been no fever. Associated symptoms include abdominal pain.  She was given Zofran dissolvable but she does not like the way it tastes and that taste makes her throw up. She was also given phenergan, which she did not take.   Patient also states that she has abdominal pain 8/10. It is intermittent and has been going on for a week. She says it feels like cramps. She says nothing makes it better or worse; she has tried Tylenol for it but it does not help. She reports some white vaginal discharge; no itching or odor.  OB History    Gravida  6   Para  3   Term  3   Preterm      AB  2   Living  3     SAB  1   TAB      Ectopic      Multiple      Live Births  3           Past Medical History:  Diagnosis Date  . Anemia   . Dysmenorrhea 07/09/2015  . Frequent headaches    lipoma tumor on brain  . Lipoma of head   . Nausea and vomiting 07/09/2015  . Pelvic pressure in female 07/09/2015  . Recurrent boils   . RUQ pain 07/09/2015    Past Surgical History:  Procedure Laterality Date  . APPENDECTOMY    . CHOLECYSTECTOMY N/A 09/02/2015   Procedure: LAPAROSCOPIC  CHOLECYSTECTOMY;  Surgeon: Aviva Signs, MD;  Location: [facility];  Service: General;  Laterality: N/A;  . INDUCED ABORTION  2005    Family History  Problem Relation Age of Onset  . Congestive Heart Failure Mother   . Asthma Mother   . Diabetes Mother   . Hypertension Mother   . COPD Mother   . Cancer Mother        liver  . Diabetes Father   . Hypertension Father   . Stroke Father   . Fibroids Sister   . Cirrhosis Maternal Grandmother   . Other Sister        ovarian cysts    Social History   Tobacco Use  . Smoking status: Current Every Day Smoker    Packs/day: 0.00    Years: 15.00    Pack years: 0.00    Types: Cigarettes  . Smokeless tobacco: Never Used  . Tobacco comment: smokes 4-5  cig daily  Substance Use Topics  . Alcohol use: Not Currently    Comment: occ  . Drug use: No    Allergies:  No Known Allergies  Medications Prior to Admission  Medication Sig Dispense Refill Last Dose  . diphenhydrAMINE HCl (BENADRYL PO) Take by mouth as needed.   Past Month at Unknown time  . ondansetron (ZOFRAN-ODT) 4 MG disintegrating tablet Take 4 mg by mouth every 8 (eight) hours as needed for nausea or vomiting.   Past Month at Unknown time  . prenatal vitamin w/FE, FA (PRENATAL 1 + 1) 27-1 MG TABS tablet Take 1 tablet by mouth daily at 12 noon. 30 each 12 Past Month at Unknown time  . promethazine (PHENERGAN) 25 MG tablet Take 1 tablet (25 mg total) by mouth every 6 (six) hours as needed for nausea or vomiting. 30 tablet 1     Review of Systems  Constitutional: Negative.   HENT: Negative.   Gastrointestinal: Positive for abdominal pain and vomiting.  Genitourinary: Positive for vaginal discharge.  Neurological: Negative.   Psychiatric/Behavioral: Negative.    Physical Exam   Blood pressure 121/84, pulse 84, temperature 99 F (37.2 C), temperature source Oral, resp. rate 17, height 5\' 2"  (1.575 m), weight 69.9 kg, last menstrual period 05/27/2018, SpO2 100 %.  Physical  Exam  Constitutional: She appears well-developed and well-nourished.  HENT:  Head: Normocephalic.  Neck: Normal range of motion.  Respiratory: Effort normal.  Genitourinary:    Genitourinary Comments: NEFG; no blood or discharge in the vagina, patient is tender over suprapubic area but has no CMT.    Neurological: She is alert.    MAU Course  Procedures  MDM -Patient very uncomfortable with pelvic exam and continues to report that her stomach just hurts.  -CMP normal -CBC normal -UA-signs of dehydration; will give IV LR with zofran, pepcid.  -[place] to assess abdominal cramping; patient has had appendectomy and gall bladder removal; low suspicion for surgical abdomen. US findings are benign; FHR is 169 -Patient reports heavy marijuana use before pregnancy but has slowed down since she found out that she was pregnant.  Will do UDS to assess for possible HEG due to cannabis use.   2030: patient back from [place]. Received Zofran at 1947: feels better; will try PO challenge  -2105: Patient feels much better; desires discharge after tolerating ginger ale and ice.    -GC chlamydia pending  -wet prep positive for clue; will defer Flagyl as patient has nausea/vomiting and will likely not tolerate Flagyl.    Assessment and Plan   1. Hyperemesis   2. Abdominal pain   3. Nausea and vomiting, intractability of vomiting not specified, unspecified vomiting type   4. Marijuana abuse    --Discharge home with Zofran tablets to swallow or insert in the vagina. Explained to patient that she should try the Zofran tablets orally and if she can't tolerate them orally she should try them vaginally. Take colace for constipation with Zofran.  -Encouraged cessation of marijuana use.  -Keep appt with FT on 6-2.  -Return to MAU if she cannot keep liquids down or if her abdominal pain worsens.   Mervyn Skeeters Rohn Fritsch 08/11/2018, 6:45 PM

## 2018-08-11 NOTE — MAU Note (Addendum)
Vomiting has gotten worse the past wk. Noted urine was getting dark a few days ago. Has taken zofran for over a wk.

## 2018-08-11 NOTE — ED Notes (Signed)
Pt c/o pelvic pain/pressure and nausea, spoke with [place] in MAU, pt transported via wheelchair to MAU

## 2018-08-11 NOTE — MAU Note (Signed)
Pt reports not been able to keep anything down for 3-4 days. Urine is dark and has not urinated since this morning. C/o pelvic pressure as well.

## 2018-08-12 ENCOUNTER — Encounter: Payer: Self-pay | Admitting: Advanced Practice Midwife

## 2018-08-12 DIAGNOSIS — F1210 Cannabis abuse, uncomplicated: Secondary | ICD-10-CM | POA: Insufficient documentation

## 2018-08-12 LAB — GC/CHLAMYDIA PROBE AMP (~~LOC~~) NOT AT ARMC
Chlamydia: NEGATIVE
Neisseria Gonorrhea: NEGATIVE

## 2018-08-19 ENCOUNTER — Other Ambulatory Visit: Payer: Self-pay | Admitting: Obstetrics & Gynecology

## 2018-08-19 DIAGNOSIS — Z3682 Encounter for antenatal screening for nuchal translucency: Secondary | ICD-10-CM

## 2018-08-22 ENCOUNTER — Telehealth: Payer: Self-pay | Admitting: *Deleted

## 2018-08-22 NOTE — Telephone Encounter (Signed)

## 2018-08-23 ENCOUNTER — Ambulatory Visit (INDEPENDENT_AMBULATORY_CARE_PROVIDER_SITE_OTHER): Payer: Medicaid Other

## 2018-08-23 ENCOUNTER — Other Ambulatory Visit: Payer: Self-pay

## 2018-08-23 ENCOUNTER — Ambulatory Visit: Payer: Medicaid Other | Admitting: *Deleted

## 2018-08-23 ENCOUNTER — Ambulatory Visit (INDEPENDENT_AMBULATORY_CARE_PROVIDER_SITE_OTHER): Payer: Medicaid Other | Admitting: Women's Health

## 2018-08-23 ENCOUNTER — Encounter: Payer: Self-pay | Admitting: Women's Health

## 2018-08-23 VITALS — BP 134/89 | HR 67 | Wt 150.0 lb

## 2018-08-23 DIAGNOSIS — F17200 Nicotine dependence, unspecified, uncomplicated: Secondary | ICD-10-CM | POA: Insufficient documentation

## 2018-08-23 DIAGNOSIS — Z1389 Encounter for screening for other disorder: Secondary | ICD-10-CM

## 2018-08-23 DIAGNOSIS — Z1379 Encounter for other screening for genetic and chromosomal anomalies: Secondary | ICD-10-CM

## 2018-08-23 DIAGNOSIS — Z3A12 12 weeks gestation of pregnancy: Secondary | ICD-10-CM | POA: Diagnosis not present

## 2018-08-23 DIAGNOSIS — K117 Disturbances of salivary secretion: Secondary | ICD-10-CM | POA: Insufficient documentation

## 2018-08-23 DIAGNOSIS — Z3481 Encounter for supervision of other normal pregnancy, first trimester: Secondary | ICD-10-CM | POA: Diagnosis not present

## 2018-08-23 DIAGNOSIS — Z3682 Encounter for antenatal screening for nuchal translucency: Secondary | ICD-10-CM | POA: Diagnosis not present

## 2018-08-23 DIAGNOSIS — O210 Mild hyperemesis gravidarum: Secondary | ICD-10-CM | POA: Insufficient documentation

## 2018-08-23 DIAGNOSIS — Z1371 Encounter for nonprocreative screening for genetic disease carrier status: Secondary | ICD-10-CM

## 2018-08-23 DIAGNOSIS — O0990 Supervision of high risk pregnancy, unspecified, unspecified trimester: Secondary | ICD-10-CM | POA: Insufficient documentation

## 2018-08-23 DIAGNOSIS — Z3402 Encounter for supervision of normal first pregnancy, second trimester: Secondary | ICD-10-CM

## 2018-08-23 DIAGNOSIS — Z3490 Encounter for supervision of normal pregnancy, unspecified, unspecified trimester: Secondary | ICD-10-CM | POA: Insufficient documentation

## 2018-08-23 DIAGNOSIS — Z331 Pregnant state, incidental: Secondary | ICD-10-CM

## 2018-08-23 LAB — POCT URINALYSIS DIPSTICK OB
Blood, UA: NEGATIVE
Glucose, UA: NEGATIVE
Leukocytes, UA: NEGATIVE
Nitrite, UA: NEGATIVE

## 2018-08-23 MED ORDER — BLOOD PRESSURE MONITOR MISC: 0 refills | Status: DC

## 2018-08-23 MED ORDER — GLYCOPYRROLATE 2 MG PO TABS: 2.0000 mg | ORAL_TABLET | Freq: Two times a day (BID) | ORAL | 3 refills | Status: DC

## 2018-08-23 MED ORDER — DOXYLAMINE-PYRIDOXINE 10-10 MG PO TBEC: DELAYED_RELEASE_TABLET | ORAL | 6 refills | Status: DC

## 2018-08-23 NOTE — Progress Notes (Signed)
INITIAL OBSTETRICAL VISIT Patient name: Cassidy Braun MRN [id number]  Date of birth: March 12, 1986 Chief Complaint:   Initial Prenatal Visit (nt/it, hyperemesis)  History of Present Illness:   Cassidy Braun is a 33 y.o. [phone] African American female at [redacted]w[redacted]d by LMP c/w 9wk u/s, with an Estimated Date of Delivery: 03/03/19 being seen today for her initial obstetrical visit.   Her obstetrical history is significant for EAB x 1, SAB x 1, term uncomplicated SVB x 3.   Today she reports continued spitting & n/v, taking zofran which does help some. Has phenergan at home she is not using. Went to [facility] 5/21 for same. Has lost 8lbs. Not much food/fluid staying down, is still able to pee/spit. 1 blunt THC daily prior to pregnancy, has slowed down some. 1ppd cigarettes prior to pregnancy, now down to 1-2 cigarettes/day. Unable to keep down pnv.  Patient's last menstrual period was 05/27/2018 (exact date). Last pap @[facility] , pt unsure of when. Results were: normal Review of Systems:   Pertinent items are noted in HPI Denies cramping/contractions, leakage of fluid, vaginal bleeding, abnormal vaginal discharge w/ itching/odor/irritation, headaches, visual changes, shortness of breath, chest pain, abdominal pain, severe nausea/vomiting, or problems with urination or bowel movements unless otherwise stated above.  Pertinent History Reviewed:  Reviewed past medical,surgical, social, obstetrical and family history.  Reviewed problem list, medications and allergies. OB History  Gravida Para Term Preterm AB Living  6 3 3   2 3   SAB TAB Ectopic Multiple Live Births  1       3    # Outcome Date GA Lbr Len/2nd Weight Sex Delivery Anes PTL Lv  6 Current           5 SAB 2013          4 Term 05/14/09 [redacted]w[redacted]d  6 lb 14 oz (3.118 kg) F Vag-Spont EPI N LIV  3 Term 11/05/05 [redacted]w[redacted]d  6 lb 11 oz (3.033 kg) M Vag-Spont EPI N LIV  2 AB 2006          1 Term 05/24/02 [redacted]w[redacted]d  6 lb (2.722 kg) F Vag-Spont EPI N LIV   Physical  Assessment:   Vitals:   08/23/18 0912  BP: 134/89  Pulse: 67  Weight: 150 lb (68 kg)  Body mass index is 27.44 kg/m.       Physical Examination:  General appearance - well appearing, and in no distress, spitting into emesis bag  Mental status - alert, oriented to person, place, and time  Psych:  She has a normal mood and affect  Skin - warm and dry, normal color, no suspicious lesions noted  Chest - effort normal, all lung fields clear to auscultation bilaterally  Heart - normal rate and regular rhythm  Abdomen - soft, nontender  Extremities:  No swelling or varicosities noted  Thin prep pap is not done  TODAY'S NT [place] 12+4 wks,measurements c/w dates,crl 63.28 mm,normal left ovary,simple right corpus luteal cyst 2.4 x 1.6 x 2 cm ,posterior placenta,NB present,NT 1.7 mm,fhr 160 bpm  Results for orders placed or performed in visit on 08/23/18 (from the past 24 hour(s))  POC Urinalysis Dipstick OB   Collection Time: 08/23/18  9:55 AM  Result Value Ref Range   Color, UA     Clarity, UA     Glucose, UA Negative Negative   Bilirubin, UA     Ketones, UA moderate    Spec Grav, UA     Blood, UA neg  pH, UA     POC,PROTEIN,UA Small (1+) Negative, Trace, Small (1+), Moderate (2+), Large (3+), 4+   Urobilinogen, UA     Nitrite, UA neg    Leukocytes, UA Negative Negative   Appearance     Odor      Assessment & Plan:  1) Low-Risk Pregnancy [id number] at [redacted]w[redacted]d with an Estimated Date of Delivery: 03/03/19   2) Initial OB visit  3) Hyperemesis, pytalism> 8lb wt loss, continue zofran, has phenergan. Rx robinul and diclegis today. Gave printed prevention/relief measures Reviewed s/s dehydration, reasons to seek care.   4) Smoker> Smokes 1-2/day down from 1ppd, counseled x 3-49mins, advised cessation, discussed risks to fetus while pregnant, to infant pp, and to herself. Offered QuitlineNC, declined  5) THC use> has decreased, advised cessation  Meds:  Meds ordered this encounter   Medications  . Blood Pressure Monitor MISC    Sig: For regular home bp monitoring during pregnancy    Dispense:  1 each    Refill:  0    Z34.90  . glycopyrrolate (ROBINUL) 2 MG tablet    Sig: Take 1 tablet (2 mg total) by mouth 2 (two) times daily.    Dispense:  60 tablet    Refill:  3    Order Specific Question:   Supervising Provider    Answer:   Elonda Husky, LUTHER H [[id number]]  . Doxylamine-Pyridoxine (DICLEGIS) 10-10 MG TBEC    Sig: 2 tabs q hs, if sx persist add 1 tab q am on day 3, if sx persist add 1 tab q afternoon on day 4    Dispense:  100 tablet    Refill:  6    Order Specific Question:   Supervising Provider    Answer:   Florian Buff [[id number]]    Initial labs obtained Continue prenatal vitamins Reviewed n/v relief measures and warning s/s to report Reviewed recommended weight gain based on pre-gravid BMI Encouraged well-balanced diet Genetic Screening discussed: requested nt/it, declined maternit21 Cystic fibrosis, SMA, Fragile X screening discussed requested Ultrasound discussed; fetal survey: requested CCNC completed>PCM not here, form faxed  Follow-up: Return in about 1 week (around 08/30/2018) for Guys in office; please get pap from [facility].   Orders Placed This Encounter  Procedures  . Urine Culture  . GC/Chlamydia Probe Amp  . Integrated 1  . Obstetric Panel, Including HIV  . Sickle cell screen  . Urinalysis, Routine w reflex microscopic  . Inheritest Core(CF97,SMA,FraX)  . POC Urinalysis Dipstick OB    Roma Schanz CNM, [facility] 08/23/2018 10:49 AM

## 2018-08-23 NOTE — Patient Instructions (Signed)
Cassidy Braun, I greatly value your feedback.  If you receive a survey following your visit with [place] today, we appreciate you taking the time to fill it out.  Thanks, Knute Neu, CNM, [facility]  [place]!!! It is now [place] at [facility] ([place], [place] [address]) Entrance located off of [address] parking   Home Blood Pressure Monitoring for Patients   Your provider has recommended that you check your blood pressure (BP) at least once a week at home. If you do not have a blood pressure cuff at home, one will be provided for you. Contact your provider if you have not received your monitor within 1 week.   Helpful Tips for Accurate Home Blood Pressure Checks  . Don't smoke, exercise, or drink caffeine 30 minutes before checking your BP . Use the restroom before checking your BP (a full bladder can raise your pressure) . Relax in a comfortable upright chair . Feet on the ground . Left arm resting comfortably on a flat surface at the level of your heart . Legs uncrossed . Back supported . Sit quietly and don't talk . Place the cuff on your bare arm . Adjust snuggly, so that only two fingertips can fit between your skin and the top of the cuff . Check 2 readings separated by at least one minute . Keep a log of your BP readings . For a visual, please reference this diagram: http://ccnc.care/bpdiagram  Provider Name: [facility]     Phone: [phone]  Zone 1: ALL CLEAR  Continue to monitor your symptoms:  . BP reading is less than 140 (top number) or less than 90 (bottom number)  . No right upper stomach pain . No headaches or seeing spots . No feeling nauseated or throwing up . No swelling in face and hands  Zone 2: CAUTION Call your doctor's office for any of the following:  . BP reading is greater than 140 (top number) or greater than 90 (bottom number)  . Stomach pain under your ribs in the middle  or right side . Headaches or seeing spots . Feeling nauseated or throwing up . Swelling in face and hands  Zone 3: EMERGENCY  Seek immediate medical care if you have any of the following:  . BP reading is greater than160 (top number) or greater than 110 (bottom number) . Severe headaches not improving with Tylenol . Serious difficulty catching your breath . Any worsening symptoms from Zone 2     Nausea & Vomiting  Have saltine crackers or pretzels by your bed and eat a few bites before you raise your head out of bed in the morning  Eat small frequent meals throughout the day instead of large meals  Drink plenty of fluids throughout the day to stay hydrated, just don't drink a lot of fluids with your meals.  This can make your stomach fill up faster making you feel sick  Do not brush your teeth right after you eat  Products with real ginger are good for nausea, like ginger ale and ginger hard candy Make sure it says made with real ginger!  Sucking on sour candy like lemon heads is also good for nausea  If your prenatal vitamins make you nauseated, take them at night so you will sleep through the nausea  Sea Bands  If you feel like you need medicine for the nausea & vomiting please let us know  If you are unable to keep any fluids or food down please let us know   Constipation  Drink plenty of fluid, preferably water, throughout the day  Eat foods high in fiber such as fruits, vegetables, and grains  Exercise, such as walking, is a good way to keep your bowels regular  Drink warm fluids, especially warm prune juice, or decaf coffee  Eat a 1/2 cup of real oatmeal (not instant), 1/2 cup applesauce, and 1/2-1 cup warm prune juice every day  If needed, you may take Colace (docusate sodium) stool softener once or twice a day to help keep the stool soft. If you are pregnant, wait until you are out of your first trimester (12-14 weeks of pregnancy)  If you still are having  problems with constipation, you may take Miralax once daily as needed to help keep your bowels regular.  If you are pregnant, wait until you are out of your first trimester (12-14 weeks of pregnancy)   First Trimester of Pregnancy The first trimester of pregnancy is from week 1 until the end of week 12 (months 1 through 3). A week after a sperm fertilizes an egg, the egg will implant on the wall of the uterus. This embryo will begin to develop into a baby. Genes from you and your partner are forming the baby. The female genes determine whether the baby is a boy or a girl. At 6-8 weeks, the eyes and face are formed, and the heartbeat can be seen on ultrasound. At the end of 12 weeks, all the baby's organs are formed.  Now that you are pregnant, you will want to do everything you can to have a healthy baby. Two of the most important things are to get good prenatal care and to follow your health care provider's instructions. Prenatal care is all the medical care you receive before the baby's birth. This care will help prevent, find, and treat any problems during the pregnancy and childbirth. BODY CHANGES Your body goes through many changes during pregnancy. The changes vary from woman to woman.   You may gain or lose a couple of pounds at first.  You may feel sick to your stomach (nauseous) and throw up (vomit). If the vomiting is uncontrollable, call your health care provider.  You may tire easily.  You may develop headaches that can be relieved by medicines approved by your health care provider.  You may urinate more often. Painful urination may mean you have a bladder infection.  You may develop heartburn as a result of your pregnancy.  You may develop constipation because certain hormones are causing the muscles that push waste through your intestines to slow down.  You may develop hemorrhoids or swollen, bulging veins (varicose veins).  Your breasts may begin to grow larger and become tender.  Your nipples may stick out more, and the tissue that surrounds them (areola) may become darker.  Your gums may bleed and may be sensitive to brushing and flossing.  Dark spots or blotches (chloasma, mask of pregnancy) may develop on your face. This will likely fade after the baby is born.  Your menstrual periods will stop.  You may have a loss of appetite.  You may develop cravings for certain kinds of food.  You may have changes in your emotions from day to day, such as being excited to be pregnant or being concerned that something may go wrong with the pregnancy and baby.  You may have more vivid and strange dreams.  You may have changes in your hair. These can include thickening of your hair, rapid growth, and changes in texture. Some women also have hair loss during or after pregnancy, or hair that feels dry or thin. Your hair will most likely return to normal after your baby is born. WHAT TO EXPECT AT YOUR PRENATAL VISITS During a routine prenatal visit:  You will be weighed to make sure you and the baby are growing normally.  Your blood pressure will be taken.  Your abdomen will be measured to track your baby's growth.  The fetal heartbeat will be listened to starting around week 10 or 12 of your pregnancy.  Test results from any previous visits will be discussed. Your health care provider may ask you:  How you are feeling.  If you are feeling the baby move.  If you have had any abnormal symptoms, such as leaking fluid, bleeding, severe headaches, or abdominal cramping.  If you have any questions. Other tests that may be performed during your first trimester include:  Blood tests to find your blood type and to check for the presence of any previous infections. They will also be used to check for low iron levels (anemia) and Rh antibodies. Later in the pregnancy, blood tests for diabetes will be done along with other tests if problems develop.  Urine tests to check for  infections, diabetes, or protein in the urine.  An ultrasound to confirm the proper growth and development of the baby.  An amniocentesis to check for possible genetic problems.  Fetal screens for spina bifida and Down syndrome.  You may need other tests to make sure you and the baby are doing well. HOME CARE INSTRUCTIONS  Medicines  Follow your health care provider's instructions regarding medicine use. Specific medicines may be either safe or unsafe to take during pregnancy.  Take your prenatal vitamins as directed.  If you develop constipation, try taking a stool softener if your health care provider approves. Diet  Eat regular, well-balanced meals. Choose a variety of foods, such as meat or vegetable-based protein, fish, milk and low-fat dairy products, vegetables, fruits, and whole grain breads and cereals. Your health care provider will help you determine the amount of weight gain that is right for you.  Avoid raw meat and uncooked cheese. These carry germs that can cause birth defects in the baby.  Eating four or five small meals rather than three large meals a day may help relieve nausea and vomiting. If you start to feel nauseous, eating a few soda crackers can be helpful. Drinking liquids between meals instead of during meals also seems to help nausea and vomiting.  If you develop constipation, eat more high-fiber foods, such as fresh vegetables or fruit and whole grains. Drink enough fluids to keep your urine clear or pale yellow. Activity and Exercise  Exercise only as directed by your health care provider. Exercising will help you:  Control your weight.  Stay in shape.  Be prepared for labor and delivery.  Experiencing pain or cramping in the lower abdomen or low back is a good sign that you should stop exercising. Check with your health care provider before continuing normal exercises.  Try to avoid standing for long periods of time. Move your legs often if you  must stand in one place for a long time.  Avoid heavy lifting.  Wear low-heeled shoes, and practice good posture.  You may continue to have sex unless your health care provider directs you otherwise.  Relief of Pain or Discomfort  Wear a good support bra for breast tenderness.    Take warm sitz baths to soothe any pain or discomfort caused by hemorrhoids. Use hemorrhoid cream if your health care provider approves.    Rest with your legs elevated if you have leg cramps or low back pain.  If you develop varicose veins in your legs, wear support hose. Elevate your feet for 15 minutes, 3-4 times a day. Limit salt in your diet. Prenatal Care  Schedule your prenatal visits by the twelfth week of pregnancy. They are usually scheduled monthly at first, then more often in the last 2 months before delivery.  Write down your questions. Take them to your prenatal visits.  Keep all your prenatal visits as directed by your health care provider. Safety  Wear your seat belt at all times when driving.  Make a list of emergency phone numbers, including numbers for family, friends, the hospital, and police and fire departments. General Tips  Ask your health care provider for a referral to a local prenatal education class. Begin classes no later than at the beginning of month 6 of your pregnancy.  Ask for help if you have counseling or nutritional needs during pregnancy. Your health care provider can offer advice or refer you to specialists for help with various needs.  Do not use hot tubs, steam rooms, or saunas.  Do not douche or use tampons or scented sanitary pads.  Do not cross your legs for long periods of time.  Avoid cat litter boxes and soil used by cats. These carry germs that can cause birth defects in the baby and possibly loss of the fetus by miscarriage or stillbirth.  Avoid all smoking, herbs, alcohol, and medicines not prescribed by your health care provider. Chemicals in these  affect the formation and growth of the baby.  Schedule a dentist appointment. At home, brush your teeth with a soft toothbrush and be gentle when you floss. SEEK MEDICAL CARE IF:   You have dizziness.  You have mild pelvic cramps, pelvic pressure, or nagging pain in the abdominal area.  You have persistent nausea, vomiting, or diarrhea.  You have a bad smelling vaginal discharge.  You have pain with urination.  You notice increased swelling in your face, hands, legs, or ankles. SEEK IMMEDIATE MEDICAL CARE IF:   You have a fever.  You are leaking fluid from your vagina.  You have spotting or bleeding from your vagina.  You have severe abdominal cramping or pain.  You have rapid weight gain or loss.  You vomit blood or material that looks like coffee grounds.  You are exposed to [place] measles and have never had them.  You are exposed to fifth disease or chickenpox.  You develop a severe headache.  You have shortness of breath.  You have any kind of trauma, such as from a fall or a car accident. Document Released: 03/03/2001 Document Revised: 07/24/2013 Document Reviewed: 01/17/2013 [facility] Patient Information 2015 [place], [place]. This information is not intended to replace advice given to you by your health care provider. Make sure you discuss any questions you have with your health care provider.

## 2018-08-23 NOTE — Progress Notes (Addendum)
[place] 12+4 wks,measurements c/w dates,crl 63.28 mm,normal left ovary,simple right corpus luteal cyst 2.4 x 1.6 x 2 cm ,posterior placenta,NB present,NT 1.7 mm,fhr 160 bpm

## 2018-08-25 LAB — URINE CULTURE

## 2018-08-25 LAB — INHERITEST CORE(CF97,SMA,FRAX)

## 2018-08-28 LAB — GC/CHLAMYDIA PROBE AMP
Chlamydia trachomatis, NAA: NEGATIVE
Neisseria Gonorrhoeae by PCR: NEGATIVE

## 2018-08-31 ENCOUNTER — Encounter: Payer: Self-pay | Admitting: *Deleted

## 2018-09-01 ENCOUNTER — Encounter: Payer: Medicaid Other | Admitting: Obstetrics & Gynecology

## 2018-09-07 LAB — OBSTETRIC PANEL, INCLUDING HIV
Antibody Screen: NEGATIVE
Basophils Absolute: 0 10*3/uL (ref 0.0–0.2)
Basos: 0 %
EOS (ABSOLUTE): 0 10*3/uL (ref 0.0–0.4)
Eos: 0 %
HIV Screen 4th Generation wRfx: NONREACTIVE
Hematocrit: 41.8 % (ref 34.0–46.6)
Hemoglobin: 13.7 g/dL (ref 11.1–15.9)
Hepatitis B Surface Ag: NEGATIVE
Immature Grans (Abs): 0 10*3/uL (ref 0.0–0.1)
Immature Granulocytes: 0 %
Lymphocytes Absolute: 3 10*3/uL (ref 0.7–3.1)
Lymphs: 31 %
MCH: 31.8 pg (ref 26.6–33.0)
MCHC: 32.8 g/dL (ref 31.5–35.7)
MCV: 97 fL (ref 79–97)
Monocytes Absolute: 0.4 10*3/uL (ref 0.1–0.9)
Monocytes: 4 %
Neutrophils Absolute: 6.1 10*3/uL (ref 1.4–7.0)
Neutrophils: 65 %
Platelets: 250 10*3/uL (ref 150–450)
RBC: 4.31 x10E6/uL (ref 3.77–5.28)
RDW: 12 % (ref 11.7–15.4)
RPR Ser Ql: NONREACTIVE
Rh Factor: POSITIVE
Rubella Antibodies, IGG: 2.9 index (ref >0.99)
WBC: 9.6 10*3/uL (ref 3.4–10.8)

## 2018-09-07 LAB — INHERITEST CORE(CF97,SMA,FRAX)

## 2018-09-07 LAB — MICROSCOPIC EXAMINATION
Bacteria, UA: NONE SEEN
Casts: NONE SEEN /lpf

## 2018-09-07 LAB — URINALYSIS, ROUTINE W REFLEX MICROSCOPIC
Bilirubin, UA: NEGATIVE
Glucose, UA: NEGATIVE
Leukocytes,UA: NEGATIVE
Nitrite, UA: NEGATIVE
RBC, UA: NEGATIVE
Specific Gravity, UA: >=1.03 — AB (ref 1.005–1.030)
Urobilinogen, Ur: 1 mg/dL (ref 0.2–1.0)
pH, UA: 6 (ref 5.0–7.5)

## 2018-09-07 LAB — INTEGRATED 1
Crown Rump Length: 63.3 mm
Gest. Age on Collection Date: 12.6 weeks
Maternal Age at EDD: 34.1 yr
Nuchal Translucency (NT): 1.7 mm
Number of Fetuses: 1
PAPP-A Value: 998.7 ng/mL
Weight: 150 [lb_av]

## 2018-09-07 LAB — SICKLE CELL SCREEN: Sickle Cell Screen: NEGATIVE

## 2018-10-04 ENCOUNTER — Ambulatory Visit (INDEPENDENT_AMBULATORY_CARE_PROVIDER_SITE_OTHER): Payer: Medicaid Other | Admitting: Obstetrics & Gynecology

## 2018-10-04 ENCOUNTER — Other Ambulatory Visit: Payer: Self-pay

## 2018-10-04 VITALS — BP 104/67 | HR 105 | Wt 162.0 lb

## 2018-10-04 DIAGNOSIS — Z1379 Encounter for other screening for genetic and chromosomal anomalies: Secondary | ICD-10-CM

## 2018-10-04 DIAGNOSIS — Z1389 Encounter for screening for other disorder: Secondary | ICD-10-CM

## 2018-10-04 DIAGNOSIS — Z331 Pregnant state, incidental: Secondary | ICD-10-CM

## 2018-10-04 DIAGNOSIS — Z3481 Encounter for supervision of other normal pregnancy, first trimester: Secondary | ICD-10-CM

## 2018-10-04 DIAGNOSIS — Z3A18 18 weeks gestation of pregnancy: Secondary | ICD-10-CM

## 2018-10-04 LAB — POCT URINALYSIS DIPSTICK OB
Blood, UA: NEGATIVE
Glucose, UA: NEGATIVE
Ketones, UA: NEGATIVE
Leukocytes, UA: NEGATIVE
Nitrite, UA: NEGATIVE
POC,PROTEIN,UA: NEGATIVE

## 2018-10-04 NOTE — Progress Notes (Signed)
   LOW-RISK PREGNANCY VISIT Patient name: Cassidy Braun MRN [id number]  Date of birth: 12-27-1985 Chief Complaint:   No chief complaint on file.  History of Present Illness:   Cassidy Braun is a 33 y.o. [phone] female at [redacted]w[redacted]d with an Estimated Date of Delivery: 03/03/19 being seen today for ongoing management of a low-risk pregnancy.  Today she reports no complaints.  . Vag. Bleeding: None.  Movement: Present. denies leaking of fluid. Review of Systems:   Pertinent items are noted in HPI Denies abnormal vaginal discharge w/ itching/odor/irritation, headaches, visual changes, shortness of breath, chest pain, abdominal pain, severe nausea/vomiting, or problems with urination or bowel movements unless otherwise stated above. Pertinent History Reviewed:  Reviewed past medical,surgical, social, obstetrical and family history.  Reviewed problem list, medications and allergies. Physical Assessment:   Vitals:   10/04/18 1614  BP: 104/67  Pulse: (!) 105  Weight: 162 lb (73.5 kg)  Body mass index is 29.63 kg/m.        Physical Examination:   General appearance: Well appearing, and in no distress  Mental status: Alert, oriented to person, place, and time  Skin: Warm & dry  Cardiovascular: Normal heart rate noted  Respiratory: Normal respiratory effort, no distress  Abdomen: Soft, gravid, nontender  Pelvic: Cervical exam deferred         Extremities: Edema: None  Fetal Status: Fetal Heart Rate (bpm): 152 Fundal Height: 20 cm Movement: Present    Results for orders placed or performed in visit on 10/04/18 (from the past 24 hour(s))  POC Urinalysis Dipstick OB   Collection Time: 10/04/18  4:18 PM  Result Value Ref Range   Color, UA     Clarity, UA     Glucose, UA Negative Negative   Bilirubin, UA     Ketones, UA neg    Spec Grav, UA     Blood, UA neg    pH, UA     POC,PROTEIN,UA Negative Negative, Trace, Small (1+), Moderate (2+), Large (3+), 4+   Urobilinogen, UA     Nitrite, UA neg    Leukocytes, UA Negative Negative   Appearance     Odor      Assessment & Plan:  1) Low-risk pregnancy [id number] at [redacted]w[redacted]d with an Estimated Date of Delivery: 03/03/19      Meds: No orders of the defined types were placed in this encounter.  Labs/procedures today:   Plan:  Continue routine obstetrical care   Reviewed: Preterm labor symptoms and general obstetric precautions including but not limited to vaginal bleeding, contractions, leaking of fluid and fetal movement were reviewed in detail with the patient.  All questions were answered.  home bp cuff. Rx faxed to . Check bp weekly, let us know if >140/90.   Follow-up: Return in about 2 weeks (around 10/18/2018) for 20 week sono, LROB.  Orders Placed This Encounter  Procedures  . US OB Comp + 14 Wk  . INTEGRATED 2  . POC Urinalysis Dipstick OB   Florian Buff  10/04/2018 4:39 PM

## 2018-10-06 LAB — INTEGRATED 2
AFP MoM: 1.14
Alpha-Fetoprotein: 54.4 ng/mL
Crown Rump Length: 63.3 mm
DIA MoM: 4.16
DIA Value: 711.1 pg/mL
Estriol, Unconjugated: 1.85 ng/mL
Gest. Age on Collection Date: 12.6 weeks
Gestational Age: 18.6 weeks
Maternal Age at EDD: 34.1 yr
Nuchal Translucency (NT): 1.7 mm
Nuchal Translucency MoM: 1.2
Number of Fetuses: 1
PAPP-A MoM: 0.92
PAPP-A Value: 998.7 ng/mL
Test Results:: NEGATIVE
Weight: 150 [lb_av]
Weight: 150 [lb_av]
hCG MoM: 1.17
hCG Value: 28.7 IU/mL
uE3 MoM: 1.2

## 2018-10-25 ENCOUNTER — Other Ambulatory Visit: Payer: Self-pay | Admitting: Obstetrics & Gynecology

## 2018-10-25 DIAGNOSIS — Z363 Encounter for antenatal screening for malformations: Secondary | ICD-10-CM

## 2018-10-25 DIAGNOSIS — Z3481 Encounter for supervision of other normal pregnancy, first trimester: Secondary | ICD-10-CM

## 2018-10-26 ENCOUNTER — Encounter: Payer: Self-pay | Admitting: Women's Health

## 2018-10-26 ENCOUNTER — Other Ambulatory Visit: Payer: Self-pay

## 2018-10-26 ENCOUNTER — Ambulatory Visit (INDEPENDENT_AMBULATORY_CARE_PROVIDER_SITE_OTHER): Payer: Medicaid Other

## 2018-10-26 ENCOUNTER — Ambulatory Visit (INDEPENDENT_AMBULATORY_CARE_PROVIDER_SITE_OTHER): Payer: Medicaid Other | Admitting: Women's Health

## 2018-10-26 VITALS — BP 117/80 | HR 82 | Wt 158.0 lb

## 2018-10-26 DIAGNOSIS — Z3A21 21 weeks gestation of pregnancy: Secondary | ICD-10-CM

## 2018-10-26 DIAGNOSIS — Z331 Pregnant state, incidental: Secondary | ICD-10-CM

## 2018-10-26 DIAGNOSIS — Z3481 Encounter for supervision of other normal pregnancy, first trimester: Secondary | ICD-10-CM

## 2018-10-26 DIAGNOSIS — Z363 Encounter for antenatal screening for malformations: Secondary | ICD-10-CM

## 2018-10-26 DIAGNOSIS — Z3482 Encounter for supervision of other normal pregnancy, second trimester: Secondary | ICD-10-CM

## 2018-10-26 DIAGNOSIS — Z1389 Encounter for screening for other disorder: Secondary | ICD-10-CM

## 2018-10-26 LAB — POCT URINALYSIS DIPSTICK OB
Blood, UA: NEGATIVE
Glucose, UA: NEGATIVE
Leukocytes, UA: NEGATIVE
Nitrite, UA: NEGATIVE

## 2018-10-26 NOTE — Patient Instructions (Addendum)
Cassidy Braun, I greatly value your feedback.  If you receive a survey following your visit with [place] today, we appreciate you taking the time to fill it out.  Thanks, Knute Neu, CNM, [facility]  [place]!!! It is now [place] at [facility] ([place], [place] [address]) Entrance located off of [address] parking   Go to [email or web address] to register for FREE online childbirth classes  [place] Pediatricians/Family Doctors:  [facility] [phone]                 [place] [phone] (usually not accepting new patients unless you have family there already, you are always welcome to call and ask)       [facility] Department [phone]       [facility] Pediatricians/Family Doctors:   [facility]: [phone]  [facility]: [phone]  [facility]: [place] Doctors:   [facility]: [facility]: [place]:  [place]: [phone]    Home Blood Pressure Monitoring for Patients   Your provider has recommended that you check your blood pressure (BP) at least once a week at home. If you do not have a blood pressure cuff at home, one will be provided for you. Contact your provider if you have not received your monitor within 1 week.   Helpful Tips for Accurate Home Blood Pressure Checks  . Don't smoke, exercise, or drink caffeine 30 minutes before checking your BP . Use the restroom before checking your BP (a full bladder can raise your pressure) . Relax in a comfortable upright chair . Feet on the ground . Left arm resting comfortably on a flat surface at the level of your heart . Legs uncrossed . Back supported . Sit quietly and don't talk . Place the cuff  on your bare arm . Adjust snuggly, so that only two fingertips can fit between your skin and the top of the cuff . Check 2 readings separated by at least one minute . Keep a log of your BP readings . For a visual, please reference this diagram: http://ccnc.care/bpdiagram  Provider Name: [facility]     Phone: [phone]  Zone 1: ALL CLEAR  Continue to monitor your symptoms:  . BP reading is less than 140 (top number) or less than 90 (bottom number)  . No right upper stomach pain . No headaches or seeing spots . No feeling nauseated or throwing up . No swelling in face and hands  Zone 2: CAUTION Call your doctor's office for any of the following:  . BP reading is greater than 140 (top number) or greater than 90 (bottom number)  . Stomach pain under your ribs in the middle or right side . Headaches or seeing spots . Feeling nauseated or throwing up . Swelling in face and hands  Zone 3: EMERGENCY  Seek immediate medical care if you have any of the following:  . BP reading is greater than160 (top number) or greater than 110 (bottom number) . Severe headaches not improving with Tylenol . Serious difficulty catching your breath . Any worsening symptoms from Zone 2   For Headaches:   Stay well hydrated, drink enough water so that your urine is clear, sometimes if you are dehydrated you can get headaches  Eat small frequent meals and snacks, sometimes if you are hungry you can get headaches  Sometimes you get headaches during pregnancy from the pregnancy hormones  You can try tylenol (1-2 regular strength 325mg  or 1-2 extra strength 500mg ) as directed on the box. The least amount of medication that works is best.   Cool compresses (cool wet washcloth or ice pack) to area of head that is hurting  You can also try drinking a caffeinated drink to see if this will help  If not helping, try below:  For Prevention of Headaches/Migraines:  CoQ10 100mg  three times daily   Vitamin B2 400mg  daily  Magnesium Oxide 400-600mg  daily  If You Get a Bad Headache/Migraine:  Benadryl 25mg    Magnesium Oxide  1 large Gatorade  2 extra strength Tylenol (1,000mg  total)  1 cup coffee or Coke  If this doesn't help please call us @ [phone]      Second Trimester of Pregnancy The second trimester is from week 14 through week 27 (months 4 through 6). The second trimester is often a time when you feel your best. Your body has adjusted to being pregnant, and you begin to feel better physically. Usually, morning sickness has lessened or quit completely, you may have more energy, and you may have an increase in appetite. The second trimester is also a time when the fetus is growing rapidly. At the end of the sixth month, the fetus is about 9 inches long and weighs about 1 pounds. You will likely begin to feel the baby move (quickening) between 16 and 20 weeks of pregnancy. Body changes during your second trimester Your body continues to go through many changes during your second trimester. The changes vary from woman to woman.  Your weight will continue to increase. You will notice your lower abdomen bulging out.  You may begin to get stretch marks on your hips, abdomen, and breasts.  You may develop headaches that can be relieved by medicines. The medicines should be approved by your health care provider.  You may urinate more often because the fetus is pressing on your bladder.  You may develop or continue to have heartburn as a result of your pregnancy.  You may develop constipation because certain hormones are causing the muscles that push waste through your intestines to slow down.  You may develop hemorrhoids or swollen, bulging veins (varicose veins).  You may have back pain. This is caused by: ? Weight gain. ? Pregnancy hormones that are relaxing the joints in your pelvis. ? A shift in weight and the muscles that support your balance.  Your breasts  will continue to grow and they will continue to become tender.  Your gums may bleed and may be sensitive to brushing and flossing.  Dark spots or blotches (chloasma, mask of pregnancy) may develop on your face. This will likely fade after the baby is born.  A dark line from your belly button to the pubic area (linea nigra) may appear. This will likely fade after the baby is born.  You may have changes in your hair. These can include thickening of your hair, rapid growth, and changes in texture. Some women also have hair loss during or after pregnancy, or hair that feels dry or thin. Your hair will most likely return to normal after your baby is born.  What to expect at prenatal visits During a routine prenatal visit:  You will be weighed to make sure you and the fetus are growing normally.  Your blood pressure will be taken.  Your abdomen will be measured to track your baby's growth.  The fetal heartbeat will be listened to.  Any test results from the previous visit will be discussed.  Your health care provider may ask you:  How you are feeling.  If you are feeling the baby move.  If you have had any abnormal symptoms, such as leaking fluid, bleeding, severe headaches, or abdominal cramping.  If you are using any tobacco products, including cigarettes, chewing tobacco, and electronic cigarettes.  If you have any questions.  Other tests that may be performed during your second trimester include:  Blood tests that check for: ? Low iron levels (anemia). ? High blood sugar that affects pregnant women (gestational diabetes) between 84 and 28 weeks. ? Rh antibodies. This is to check for a protein on red blood cells (Rh factor).  Urine tests to check for infections, diabetes, or protein in the urine.  An ultrasound to confirm the proper growth and development of the baby.  An amniocentesis to check for possible genetic problems.  Fetal screens for spina bifida and Down  syndrome.  HIV (human immunodeficiency virus) testing. Routine prenatal testing includes screening for HIV, unless you choose not to have this test.  Follow these instructions at home: Medicines  Follow your health care provider's instructions regarding medicine use. Specific medicines may be either safe or unsafe to take during pregnancy.  Take a prenatal vitamin that contains at least 600 micrograms (mcg) of folic acid.  If you develop constipation, try taking a stool softener if your health care provider approves. Eating and drinking  Eat a balanced diet that includes fresh fruits and vegetables, whole grains, good sources of protein such as meat, eggs, or tofu, and low-fat dairy. Your health care provider will help you determine the amount of weight gain that is right for you.  Avoid raw meat and uncooked cheese. These carry germs that can cause birth defects in the baby.  If you have low calcium intake from food, talk to your health care provider about whether you should take a daily calcium supplement.  Limit foods that are high in fat and processed sugars, such as fried and sweet foods.  To prevent constipation: ? Drink enough fluid to keep your urine clear or pale yellow. ? Eat foods that are high in fiber, such as fresh fruits and vegetables, whole grains, and beans. Activity  Exercise only as directed by your health care provider. Most women can continue their usual exercise routine during pregnancy. Try to exercise for 30 minutes at least 5 days a week. Stop exercising if you experience uterine contractions.  Avoid heavy lifting, wear low heel shoes, and practice good posture.  A sexual relationship may be continued unless your health care provider directs you otherwise. Relieving pain and discomfort  Wear a good support bra to prevent discomfort from breast tenderness.  Take warm sitz baths to soothe any pain or discomfort caused by hemorrhoids. Use hemorrhoid cream if  your health care provider approves.  Rest with your legs elevated if you have leg cramps or low back pain.  If you develop varicose veins, wear support hose. Elevate your feet for 15 minutes, 3-4 times a day. Limit salt in your diet. Prenatal Care  Write down your questions. Take them to your prenatal visits.  Keep all your prenatal visits as told by your health care provider. This is important. Safety  Wear your seat belt at all times  when driving.  Make a list of emergency phone numbers, including numbers for family, friends, the hospital, and police and fire departments. General instructions  Ask your health care provider for a referral to a local prenatal education class. Begin classes no later than the beginning of month 6 of your pregnancy.  Ask for help if you have counseling or nutritional needs during pregnancy. Your health care provider can offer advice or refer you to specialists for help with various needs.  Do not use hot tubs, steam rooms, or saunas.  Do not douche or use tampons or scented sanitary pads.  Do not cross your legs for long periods of time.  Avoid cat litter boxes and soil used by cats. These carry germs that can cause birth defects in the baby and possibly loss of the fetus by miscarriage or stillbirth.  Avoid all smoking, herbs, alcohol, and unprescribed drugs. Chemicals in these products can affect the formation and growth of the baby.  Do not use any products that contain nicotine or tobacco, such as cigarettes and e-cigarettes. If you need help quitting, ask your health care provider.  Visit your dentist if you have not gone yet during your pregnancy. Use a soft toothbrush to brush your teeth and be gentle when you floss. Contact a health care provider if:  You have dizziness.  You have mild pelvic cramps, pelvic pressure, or nagging pain in the abdominal area.  You have persistent nausea, vomiting, or diarrhea.  You have a bad smelling  vaginal discharge.  You have pain when you urinate. Get help right away if:  You have a fever.  You are leaking fluid from your vagina.  You have spotting or bleeding from your vagina.  You have severe abdominal cramping or pain.  You have rapid weight gain or weight loss.  You have shortness of breath with chest pain.  You notice sudden or extreme swelling of your face, hands, ankles, feet, or legs.  You have not felt your baby move in over an hour.  You have severe headaches that do not go away when you take medicine.  You have vision changes. Summary  The second trimester is from week 14 through week 27 (months 4 through 6). It is also a time when the fetus is growing rapidly.  Your body goes through many changes during pregnancy. The changes vary from woman to woman.  Avoid all smoking, herbs, alcohol, and unprescribed drugs. These chemicals affect the formation and growth your baby.  Do not use any tobacco products, such as cigarettes, chewing tobacco, and e-cigarettes. If you need help quitting, ask your health care provider.  Contact your health care provider if you have any questions. Keep all prenatal visits as told by your health care provider. This is important. This information is not intended to replace advice given to you by your health care provider. Make sure you discuss any questions you have with your health care provider. Document Released: 03/03/2001 Document Revised: 08/15/2015 Document Reviewed: 05/10/2012 Elsevier Interactive Patient Education  2017 [employer].

## 2018-10-26 NOTE — Progress Notes (Signed)
   LOW-RISK PREGNANCY VISIT Patient name: Cassidy Braun MRN [id number]  Date of birth: Oct 11, 1985 Chief Complaint:   Routine Prenatal Visit (u/s)  History of Present Illness:   Cassidy Braun is a 33 y.o. [id number] female at [redacted]w[redacted]d with an Estimated Date of Delivery: 03/03/19 being seen today for ongoing management of a low-risk pregnancy.  Today she reports some headaches, has h/o ?brain tumor/lipoma dx in 2016 at [facility], was supposed to go to neurologist, ended up going to [facility] ED, no f/u since. Can't do pap today. Hyperemesis much improved!  Marland Kitchen  .  Movement: Present. denies leaking of fluid. Review of Systems:   Pertinent items are noted in HPI Denies abnormal vaginal discharge w/ itching/odor/irritation, headaches, visual changes, shortness of breath, chest pain, abdominal pain, severe nausea/vomiting, or problems with urination or bowel movements unless otherwise stated above. Pertinent History Reviewed:  Reviewed past medical,surgical, social, obstetrical and family history.  Reviewed problem list, medications and allergies. Physical Assessment:   Vitals:   10/26/18 1443  BP: 117/80  Pulse: 82  Weight: 158 lb (71.7 kg)  Body mass index is 28.9 kg/m.        Physical Examination:   General appearance: Well appearing, and in no distress  Mental status: Alert, oriented to person, place, and time  Skin: Warm & dry  Cardiovascular: Normal heart rate noted  Respiratory: Normal respiratory effort, no distress  Abdomen: Soft, gravid, nontender  Pelvic: Cervical exam deferred         Extremities: Edema: None  Fetal Status:     Movement: Present    [place] 21+5 wks,breech,posterior placenta gr 0,normal ovaries bilat,cx 4.7 cm,fhr 161 bpm,svp of fluid 4.4 cm,EFW 430 g 34%,anatomy complete,no obvious abnormalities   Results for orders placed or performed in visit on 10/26/18 (from the past 24 hour(s))  POC Urinalysis Dipstick OB   Collection Time: 10/26/18  2:53 PM  Result Value Ref  Range   Color, UA     Clarity, UA     Glucose, UA Negative Negative   Bilirubin, UA     Ketones, UA trace    Spec Grav, UA     Blood, UA neg    pH, UA     POC,PROTEIN,UA Trace Negative, Trace, Small (1+), Moderate (2+), Large (3+), 4+   Urobilinogen, UA     Nitrite, UA neg    Leukocytes, UA Negative Negative   Appearance     Odor      Assessment & Plan:  1) Low-risk pregnancy [id number] at [redacted]w[redacted]d with an Estimated Date of Delivery: 03/03/19   2) ?brain tumor, dx in 2016, ?lipoma, requested records from [place]: No orders of the defined types were placed in this encounter.  Labs/procedures today: anatomy u/s  Plan:  Continue routine obstetrical care   Reviewed: Preterm labor symptoms and general obstetric precautions including but not limited to vaginal bleeding, contractions, leaking of fluid and fetal movement were reviewed in detail with the patient.  All questions were answered. Has home bp cuff.  Check bp weekly, let us know if >140/90.   Follow-up: Return in about 4 weeks (around 11/23/2018) for LROB, [place]; get records from [place] (imaging/brain tumor).  Orders Placed This Encounter  Procedures  . POC Urinalysis Dipstick OB   Roma Schanz CNM, [facility] 10/26/2018 3:13 PM

## 2018-10-26 NOTE — Progress Notes (Signed)
[place] 21+5 wks,breech,posterior placenta gr 0,normal ovaries bilat,cx 4.7 cm,fhr 161 bpm,svp of fluid 4.4 cm,EFW 430 g 34%,anatomy complete,no obvious abnormalities

## 2018-11-08 ENCOUNTER — Encounter: Payer: Self-pay | Admitting: Women's Health

## 2018-11-08 DIAGNOSIS — G9389 Other specified disorders of brain: Secondary | ICD-10-CM | POA: Insufficient documentation

## 2018-11-23 ENCOUNTER — Telehealth (INDEPENDENT_AMBULATORY_CARE_PROVIDER_SITE_OTHER): Payer: Medicaid Other | Admitting: Obstetrics and Gynecology

## 2018-11-23 ENCOUNTER — Other Ambulatory Visit: Payer: Self-pay

## 2018-11-23 DIAGNOSIS — Z3402 Encounter for supervision of normal first pregnancy, second trimester: Secondary | ICD-10-CM

## 2018-11-23 DIAGNOSIS — Z3A25 25 weeks gestation of pregnancy: Secondary | ICD-10-CM | POA: Diagnosis not present

## 2018-11-23 NOTE — Progress Notes (Addendum)
Patient ID: Cassidy Braun, female   DOB: Aug 23, 1985, 33 y.o.   MRN: [id number]    TELEHEALTH VIRTUAL OBSTETRICS VISIT ENCOUNTER NOTE  I connected with Cassidy Braun on @TODAY @ at  1:30 PM EDT by telephone at home and verified that I am speaking with the correct person using two identifiers.   I discussed the limitations, risks, security and privacy concerns of performing an evaluation and management service by telephone and the availability of in person appointments. I also discussed with the patient that there may be a patient responsible charge related to this service. The patient expressed understanding and agreed to proceed.  Subjective:  Cassidy Braun is a 32 y.o. [phone] at [redacted]w[redacted]d being followed for ongoing prenatal care.  She is currently monitored for the following issues for this low-risk pregnancy and has Marijuana abuse; Supervision of normal pregnancy; Smoker; Hyperemesis gravidarum; Ptyalism; and Congenital brain lipoma  on their problem list.  Patient reports leg cramps, both legs,  Reports fetal movement. Denies any contractions, bleeding or leaking of fluid.  Reports BP's are " pretty good" 0000000 systolic, 87 diastolic. Checks it weekly.to change to daily to help her remember. The following portions of the patient's history were reviewed and updated as appropriate: allergies, current medications, past family history, past medical history, past social history, past surgical history and problem list.   Objective:   General:  Alert, oriented and cooperative.   Mental Status: Normal mood and affect perceived. Normal judgment and thought content.  Rest of physical exam deferred due to type of encounter  Assessment and Plan:  Pregnancy: [id number] at [redacted]w[redacted]d  Preterm labor symptoms and general obstetric precautions including but not limited to vaginal bleeding, contractions, leaking of fluid and fetal movement were reviewed in detail with the patient.  Pt to add LITE Salt (  potassium supplement), bananas, I discussed the assessment and treatment plan with the patient. The patient was provided an opportunity to ask questions and all were answered. The patient agreed with the plan and demonstrated an understanding of the instructions. The patient was advised to call back or seek an in-person office evaluation/go to [facility] at [facility] for any urgent or concerning symptoms. Please refer to After Visit Summary for other counseling recommendations.   I provided 8 minutes of non-face-to-face time during this encounter.  No follow-ups on file.  No future appointments. wil have pt return in 2 wk for LROB, and PN2 labs.  By signing my name below, I, Samul Dada, attest that this documentation has been prepared under the direction and in the presence of Jonnie Kind, MD. Electronically Signed: [place]. 11/23/18. 1:45 PM.  I personally performed the services described in this documentation, which was SCRIBED in my presence. The recorded information has been reviewed and considered accurate. It has been edited as necessary during review. Jonnie Kind, MD

## 2018-12-07 ENCOUNTER — Encounter: Payer: Medicaid Other | Admitting: Obstetrics and Gynecology

## 2018-12-07 ENCOUNTER — Other Ambulatory Visit: Payer: Medicaid Other

## 2018-12-14 ENCOUNTER — Encounter: Payer: Self-pay | Admitting: Advanced Practice Midwife

## 2018-12-14 ENCOUNTER — Other Ambulatory Visit: Payer: Self-pay

## 2018-12-14 ENCOUNTER — Other Ambulatory Visit: Payer: Medicaid Other

## 2018-12-14 ENCOUNTER — Ambulatory Visit (INDEPENDENT_AMBULATORY_CARE_PROVIDER_SITE_OTHER): Payer: Medicaid Other | Admitting: Advanced Practice Midwife

## 2018-12-14 VITALS — BP 130/86 | HR 60 | Wt 169.0 lb

## 2018-12-14 DIAGNOSIS — Z1389 Encounter for screening for other disorder: Secondary | ICD-10-CM

## 2018-12-14 DIAGNOSIS — Z3A28 28 weeks gestation of pregnancy: Secondary | ICD-10-CM

## 2018-12-14 DIAGNOSIS — Z3483 Encounter for supervision of other normal pregnancy, third trimester: Secondary | ICD-10-CM

## 2018-12-14 DIAGNOSIS — Z302 Encounter for sterilization: Secondary | ICD-10-CM

## 2018-12-14 DIAGNOSIS — Z331 Pregnant state, incidental: Secondary | ICD-10-CM

## 2018-12-14 LAB — POCT URINALYSIS DIPSTICK OB
Blood, UA: NEGATIVE
Glucose, UA: NEGATIVE
Leukocytes, UA: NEGATIVE
Nitrite, UA: NEGATIVE

## 2018-12-14 MED ORDER — ONDANSETRON 4 MG PO TBDP: 4.0000 mg | ORAL_TABLET | Freq: Three times a day (TID) | ORAL | 3 refills | Status: DC | PRN

## 2018-12-14 NOTE — Progress Notes (Signed)
LOW-RISK PREGNANCY VISIT Patient name: Cassidy Braun MRN [id number]  Date of birth: 1985/07/17 Chief Complaint:   Routine Prenatal Visit (vomiting; leg cramps)  History of Present Illness:   Cassidy Braun is a 33 y.o. [id number] female at [redacted]w[redacted]d with an Estimated Date of Delivery: 03/03/19 being seen today for ongoing management of a low-risk pregnancy.  Today she reports intermittent vomiting; hasn't been drinking much water; also with leg cramps during the night. Contractions: Not present. Vag. Bleeding: None.  Movement: Present. denies leaking of fluid.  Interested in ppBTL- rev'd Filschie clips vs salpingectomy. Review of Systems:   Pertinent items are noted in HPI Denies abnormal vaginal discharge w/ itching/odor/irritation, headaches, visual changes, shortness of breath, chest pain, abdominal pain, severe nausea/vomiting, or problems with urination or bowel movements unless otherwise stated above. Pertinent History Reviewed:  Reviewed past medical,surgical, social, obstetrical and family history.  Reviewed problem list, medications and allergies. Physical Assessment:   Vitals:   12/14/18 1408  BP: 130/86  Pulse: 60  Weight: 169 lb (76.7 kg)  Body mass index is 30.91 kg/m.        Physical Examination:   General appearance: Well appearing, and in no distress  Mental status: Alert, oriented to person, place, and time  Skin: Warm & dry  Cardiovascular: Normal heart rate noted  Respiratory: Normal respiratory effort, no distress  Abdomen: Soft, gravid, nontender  Pelvic: Cervical exam deferred         Extremities: Edema: Trace  Fetal Status:     Movement: Present    Results for orders placed or performed in visit on 12/14/18 (from the past 24 hour(s))  POC Urinalysis Dipstick OB   Collection Time: 12/14/18  2:09 PM  Result Value Ref Range   Color, UA     Clarity, UA     Glucose, UA Negative Negative   Bilirubin, UA     Ketones, UA 4+    Spec Grav, UA     Blood,  UA neg    pH, UA     POC,PROTEIN,UA Trace Negative, Trace, Small (1+), Moderate (2+), Large (3+), 4+   Urobilinogen, UA     Nitrite, UA neg    Leukocytes, UA Negative Negative   Appearance     Odor      Assessment & Plan:  1) Low-risk pregnancy [id number] at [redacted]w[redacted]d with an Estimated Date of Delivery: 03/03/19   2) Ketonuria: recommend increase water/fluid intake  3) Desires sterilization: will sign BTL papers today   Meds:  Meds ordered this encounter  Medications  . ondansetron (ZOFRAN ODT) 4 MG disintegrating tablet    Sig: Take 1 tablet (4 mg total) by mouth every 8 (eight) hours as needed for nausea or vomiting.    Dispense:  30 tablet    Refill:  3   Labs/procedures today: did not come fasting for GTT- will do PN2 next week only, and then have virtual visit in 2 weeks  Plan:  Continue routine obstetrical care- call with elevated BPs >130/90  Reviewed: Preterm labor symptoms and general obstetric precautions including but not limited to vaginal bleeding, contractions, leaking of fluid and fetal movement were reviewed in detail with the patient.  All questions were answered. Has home bp cuff.  Check bp weekly, let us know if >140/90.   Follow-up: Return in about 1 week (around 12/21/2018) for 1 wk for PN2; 2 wks for virtual LROB visit, Sign BTL consent today.  Orders Placed This Encounter  Procedures  . POC  Urinalysis Dipstick OB   Myrtis Ser [facility] 12/14/2018 2:29 PM

## 2018-12-14 NOTE — Patient Instructions (Signed)
Postpartum Tubal Ligation Postpartum tubal ligation (PPTL) is a procedure to close the fallopian tubes. This is done so that you cannot get pregnant. When the fallopian tubes are closed, the eggs that the ovaries release cannot enter the uterus, and sperm cannot reach the eggs. PPTL is done right after childbirth or 1-2 days after childbirth, before the uterus returns to its normal location. If you have a cesarean section, it can be performed at the same time as the procedure. Having this done after childbirth does not make your stay in the hospital longer. PPTL is sometimes called "getting your tubes tied." You should not have this procedure if you want to get pregnant again or if you are unsure about having more children. Tell a health care provider about:  Any allergies you have.  All medicines you are taking, including vitamins, herbs, eye drops, creams, and over-the-counter medicines.  Any problems you or family members have had with anesthetic medicines.  Any blood disorders you have.  Any surgeries you have had.  Any medical conditions you have or have had.  Any past pregnancies. What are the risks? Generally, this is a safe procedure. However, problems may occur, including:  Infection.  Bleeding.  Injury to other organs in the abdomen.  Side effects from anesthetic medicines.  Failure of the procedure. If this happens, you could get pregnant.  Having a fertilized egg attach outside the uterus (ectopic pregnancy). What happens before the procedure?  Ask your health care provider about: ? How much pain you can expect to have. ? What medicines you will be given for pain, especially if you are planning to breastfeed. What happens during the procedure? If you had a vaginal delivery:  You will be given one or more of the following: ? A medicine to help you relax (sedative). ? A medicine to numb the area (local anesthetic). ? A medicine to make you fall asleep (general  anesthetic). ? A medicine that is injected into an area of your body to numb everything below the injection site (regional anesthetic).  If you have been given a general anesthetic, a tube will be put down your throat to help you breathe.  An IV will be inserted into one of your veins.  Your bladder may be emptied with a small tube (catheter).  An incision will be made just below your belly button.  Your fallopian tubes will be located and brought up through the incision.  Your fallopian tubes will be tied off, burned (cauterized), or blocked with a clip, ring, or clamp. A small part in the center of each fallopian tube may be removed.  The incision will be closed with stitches (sutures).  A bandage (dressing) will be placed over the incision. If you had a cesarean delivery:  Tubal ligation will be done through the incision that was used for the cesarean delivery of your baby.  The incision will be closed with sutures.  A dressing will be placed over the incision. The procedure may vary among health care providers and hospitals. What happens after the procedure?  Your blood pressure, heart rate, breathing rate, and blood oxygen level will be monitored until you leave the hospital.  You will be given pain medicine as needed.  Do not drive for 24 hours if you were given a sedative during your procedure. Summary  Postpartum tubal ligation is a procedure that closes the fallopian tubes so you cannot get pregnant anymore.  This procedure is done while you are still   in the hospital after childbirth. If you have a cesarean section, it can be performed at the same time.  Having this done after childbirth does not make your stay in the hospital longer.  Postpartum tubal ligation is considered permanent. You should not have this procedure if you want to get pregnant again or if you are unsure about having more children.  Talk to your health care provider to see if this procedure is  right for you. This information is not intended to replace advice given to you by your health care provider. Make sure you discuss any questions you have with your health care provider. Document Released: 03/09/2005 Document Revised: 01/27/2018 Document Reviewed: 01/27/2018 Elsevier Patient Education  2020 [employer].

## 2018-12-21 ENCOUNTER — Other Ambulatory Visit: Payer: Medicaid Other

## 2018-12-21 ENCOUNTER — Other Ambulatory Visit: Payer: Self-pay

## 2018-12-21 DIAGNOSIS — Z3A29 29 weeks gestation of pregnancy: Secondary | ICD-10-CM

## 2018-12-21 DIAGNOSIS — Z3483 Encounter for supervision of other normal pregnancy, third trimester: Secondary | ICD-10-CM

## 2018-12-21 DIAGNOSIS — Z331 Pregnant state, incidental: Secondary | ICD-10-CM

## 2018-12-21 DIAGNOSIS — Z1389 Encounter for screening for other disorder: Secondary | ICD-10-CM

## 2018-12-22 ENCOUNTER — Encounter (HOSPITAL_COMMUNITY): Payer: Self-pay

## 2018-12-22 ENCOUNTER — Other Ambulatory Visit: Payer: Self-pay

## 2018-12-22 ENCOUNTER — Inpatient Hospital Stay (HOSPITAL_COMMUNITY): Payer: Medicaid Other

## 2018-12-22 ENCOUNTER — Inpatient Hospital Stay (HOSPITAL_COMMUNITY)
Admission: AD | Admit: 2018-12-22 | Discharge: 2018-12-22 | Disposition: A | Discharge status: Home / Self Care | Payer: Medicaid Other | Attending: Obstetrics & Gynecology | Admitting: Obstetrics & Gynecology

## 2018-12-22 ENCOUNTER — Telehealth: Payer: Self-pay | Admitting: *Deleted

## 2018-12-22 DIAGNOSIS — Z3A29 29 weeks gestation of pregnancy: Secondary | ICD-10-CM

## 2018-12-22 DIAGNOSIS — O99333 Smoking (tobacco) complicating pregnancy, third trimester: Secondary | ICD-10-CM | POA: Insufficient documentation

## 2018-12-22 DIAGNOSIS — R519 Headache, unspecified: Secondary | ICD-10-CM | POA: Diagnosis not present

## 2018-12-22 DIAGNOSIS — R1011 Right upper quadrant pain: Secondary | ICD-10-CM

## 2018-12-22 DIAGNOSIS — R030 Elevated blood-pressure reading, without diagnosis of hypertension: Secondary | ICD-10-CM | POA: Diagnosis present

## 2018-12-22 DIAGNOSIS — F17210 Nicotine dependence, cigarettes, uncomplicated: Secondary | ICD-10-CM | POA: Insufficient documentation

## 2018-12-22 DIAGNOSIS — D179 Benign lipomatous neoplasm, unspecified: Secondary | ICD-10-CM

## 2018-12-22 DIAGNOSIS — O26893 Other specified pregnancy related conditions, third trimester: Secondary | ICD-10-CM

## 2018-12-22 DIAGNOSIS — H538 Other visual disturbances: Secondary | ICD-10-CM

## 2018-12-22 DIAGNOSIS — Z3689 Encounter for other specified antenatal screening: Secondary | ICD-10-CM

## 2018-12-22 LAB — CBC
HCT: 34.6 % — ABNORMAL LOW (ref 36.0–46.0)
Hematocrit: 37.2 % (ref 34.0–46.6)
Hemoglobin: 12.6 g/dL (ref 11.1–15.9)
Hemoglobin: 12.2 g/dL (ref 12.0–15.0)
MCH: 32 pg (ref 26.6–33.0)
MCH: 33.1 pg (ref 26.0–34.0)
MCHC: 33.9 g/dL (ref 31.5–35.7)
MCHC: 35.3 g/dL (ref 30.0–36.0)
MCV: 94 fL (ref 79–97)
MCV: 93.8 fL (ref 80.0–100.0)
Platelets: 327 10*3/uL (ref 150–450)
Platelets: 302 10*3/uL (ref 150–400)
RBC: 3.94 x10E6/uL (ref 3.77–5.28)
RBC: 3.69 MIL/uL — ABNORMAL LOW (ref 3.87–5.11)
RDW: 11.9 % (ref 11.7–15.4)
RDW: 11.9 % (ref 11.5–15.5)
WBC: 9.5 10*3/uL (ref 3.4–10.8)
WBC: 9.7 10*3/uL (ref 4.0–10.5)
nRBC: 0 % (ref 0.0–0.2)

## 2018-12-22 LAB — COMPREHENSIVE METABOLIC PANEL
ALT: 15 U/L (ref 0–44)
AST: 16 U/L (ref 15–41)
Albumin: 2.8 g/dL — ABNORMAL LOW (ref 3.5–5.0)
Alkaline Phosphatase: 139 U/L — ABNORMAL HIGH (ref 38–126)
Anion gap: 9 (ref 5–15)
BUN: 6 mg/dL (ref 6–20)
CO2: 23 mmol/L (ref 22–32)
Calcium: 8.7 mg/dL — ABNORMAL LOW (ref 8.9–10.3)
Chloride: 104 mmol/L (ref 98–111)
Creatinine, Ser: 0.48 mg/dL (ref 0.44–1.00)
GFR calc Af Amer: >60 mL/min (ref >60)
GFR calc non Af Amer: >60 mL/min (ref >60)
Glucose, Bld: 78 mg/dL (ref 70–99)
Potassium: 3.7 mmol/L (ref 3.5–5.1)
Sodium: 136 mmol/L (ref 135–145)
Total Bilirubin: 0.4 mg/dL (ref 0.3–1.2)
Total Protein: 6.6 g/dL (ref 6.5–8.1)

## 2018-12-22 LAB — PROTEIN / CREATININE RATIO, URINE
Creatinine, Urine: 72.07 mg/dL
Protein Creatinine Ratio: 0.11 mg/mg{Cre} (ref 0.00–0.15)
Total Protein, Urine: 8 mg/dL

## 2018-12-22 LAB — GLUCOSE TOLERANCE, 2 HOURS W/ 1HR
Glucose, 1 hour: 162 mg/dL (ref 65–179)
Glucose, 2 hour: 123 mg/dL (ref 65–152)
Glucose, Fasting: 69 mg/dL (ref 65–91)

## 2018-12-22 LAB — ANTIBODY SCREEN: Antibody Screen: NEGATIVE

## 2018-12-22 LAB — RPR: RPR Ser Ql: NONREACTIVE

## 2018-12-22 LAB — HIV ANTIBODY (ROUTINE TESTING W REFLEX): HIV Screen 4th Generation wRfx: NONREACTIVE

## 2018-12-22 MED ORDER — BUTALBITAL-APAP-CAFFEINE 50-325-40 MG PO TABS: 1.0000 | ORAL_TABLET | Freq: Four times a day (QID) | ORAL | 0 refills | Status: DC | PRN

## 2018-12-22 MED ORDER — BUTALBITAL-APAP-CAFFEINE 50-325-40 MG PO TABS
2.0000 | ORAL_TABLET | Freq: Once | ORAL | Status: AC
  Administered 2018-12-22: 19:00:00 2 via ORAL
  Filled 2018-12-22: qty 2

## 2018-12-22 NOTE — Discharge Instructions (Signed)
Abdominal Pain During Pregnancy  Abdominal pain is common during pregnancy, and has many possible causes. Some causes are more serious than others, and sometimes the cause is not known. Abdominal pain can be a sign that labor is starting. It can also be caused by normal growth and stretching of muscles and ligaments during pregnancy. Always tell your health care provider if you have any abdominal pain. Follow these instructions at home:  Do not have sex or put anything in your vagina until your pain goes away completely.  Get plenty of rest until your pain improves.  Drink enough fluid to keep your urine pale yellow.  Take over-the-counter and prescription medicines only as told by your health care provider.  Keep all follow-up visits as told by your health care provider. This is important. Contact a health care provider if:  Your pain continues or gets worse after resting.  You have lower abdominal pain that: ? Comes and goes at regular intervals. ? Spreads to your back. ? Is similar to menstrual cramps.  You have pain or burning when you urinate. Get help right away if:  You have a fever or chills.  You have vaginal bleeding.  You are leaking fluid from your vagina.  You are passing tissue from your vagina.  You have vomiting or diarrhea that lasts for more than 24 hours.  Your baby is moving less than usual.  You feel very weak or faint.  You have shortness of breath.  You develop severe pain in your upper abdomen. Summary  Abdominal pain is common during pregnancy, and has many possible causes.  If you experience abdominal pain during pregnancy, tell your health care provider right away.  Follow your health care provider's home care instructions and keep all follow-up visits as directed. This information is not intended to replace advice given to you by your health care provider. Make sure you discuss any questions you have with your health care  provider. Document Released: 03/09/2005 Document Revised: 06/27/2018 Document Reviewed: 06/11/2016 Elsevier Patient Education  [place]. Preeclampsia and Eclampsia Preeclampsia is a serious condition that may develop during pregnancy. This condition causes high blood pressure and increased protein in your urine along with other symptoms, such as headaches and vision changes. These symptoms may develop as the condition gets worse. Preeclampsia may occur at 20 weeks of pregnancy or later. Diagnosing and treating preeclampsia early is very important. If not treated early, it can cause serious problems for you and your baby. One problem it can lead to is eclampsia. Eclampsia is a condition that causes muscle jerking or shaking (convulsions or seizures) and other serious problems for the mother. During pregnancy, delivering your baby may be the best treatment for preeclampsia or eclampsia. For most women, preeclampsia and eclampsia symptoms go away after giving birth. In rare cases, a woman may develop preeclampsia after giving birth (postpartum preeclampsia). This usually occurs within 48 hours after childbirth but may occur up to 6 weeks after giving birth. What are the causes? The cause of preeclampsia is not known. What increases the risk? The following risk factors make you more likely to develop preeclampsia:  Being pregnant for the first time.  Having had preeclampsia during a past pregnancy.  Having a family history of preeclampsia.  Having high blood pressure.  Being pregnant with more than one baby.  Being 58 or older.  Being African-American.  Having kidney disease or diabetes.  Having medical conditions such as lupus or blood diseases.  Being very overweight (obese). What are the signs or symptoms? The most common symptoms are:  Severe headaches.  Vision problems, such as blurred or double vision.  Abdominal pain, especially upper abdominal pain. Other symptoms  that may develop as the condition gets worse include:  Sudden weight gain.  Sudden swelling of the hands, face, legs, and feet.  Severe nausea and vomiting.  Numbness in the face, arms, legs, and feet.  Dizziness.  Urinating less than usual.  Slurred speech.  Convulsions or seizures. How is this diagnosed? There are no screening tests for preeclampsia. Your health care provider will ask you about symptoms and check for signs of preeclampsia during your prenatal visits. You may also have tests that include:  Checking your blood pressure.  Urine tests to check for protein. Your health care provider will check for this at every prenatal visit.  Blood tests.  Monitoring your baby's heart rate.  Ultrasound. How is this treated? You and your health care provider will determine the treatment approach that is best for you. Treatment may include:  Having more frequent prenatal exams to check for signs of preeclampsia, if you have an increased risk for preeclampsia.  Medicine to lower your blood pressure.  Staying in the hospital, if your condition is severe. There, treatment will focus on controlling your blood pressure and the amount of fluids in your body (fluid retention).  Taking medicine (magnesium sulfate) to prevent seizures. This may be given as an injection or through an IV.  Taking a low-dose aspirin during your pregnancy.  Delivering your baby early. You may have your labor started with medicine (induced), or you may have a cesarean delivery. Follow these instructions at home: Eating and drinking   Drink enough fluid to keep your urine pale yellow.  Avoid caffeine. Lifestyle  Do not use any products that contain nicotine or tobacco, such as cigarettes and e-cigarettes. If you need help quitting, ask your health care provider.  Do not use alcohol or drugs.  Avoid stress as much as possible. Rest and get plenty of sleep. General instructions  Take  over-the-counter and prescription medicines only as told by your health care provider.  When lying down, lie on your left side. This keeps pressure off your major blood vessels.  When sitting or lying down, raise (elevate) your feet. Try putting some pillows underneath your lower legs.  Exercise regularly. Ask your health care provider what kinds of exercise are best for you.  Keep all follow-up and prenatal visits as told by your health care provider. This is important. How is this prevented? There is no known way of preventing preeclampsia or eclampsia from developing. However, to lower your risk of complications and detect problems early:  Get regular prenatal care. Your health care provider may be able to diagnose and treat the condition early.  Maintain a healthy weight. Ask your health care provider for help managing weight gain during pregnancy.  Work with your health care provider to manage any long-term (chronic) health conditions you have, such as diabetes or kidney problems.  You may have tests of your blood pressure and kidney function after giving birth.  Your health care provider may have you take low-dose aspirin during your next pregnancy. Contact a health care provider if:  You have symptoms that your health care provider told you may require more treatment or monitoring, such as: ? Headaches. ? Nausea or vomiting. ? Abdominal pain. ? Dizziness. ? Light-headedness. Get help right away if:  You have severe: ? Abdominal pain. ? Headaches that do not get better. ? Dizziness. ? Vision problems. ? Confusion. ? Nausea or vomiting.  You have any of the following: ? A seizure. ? Sudden, rapid weight gain. ? Sudden swelling in your hands, ankles, or face. ? Trouble moving any part of your body. ? Numbness in any part of your body. ? Trouble speaking. ? Abnormal bleeding.  You faint. Summary  Preeclampsia is a serious condition that may develop during  pregnancy.  This condition causes high blood pressure and increased protein in your urine along with other symptoms, such as headaches and vision changes.  Diagnosing and treating preeclampsia early is very important. If not treated early, it can cause serious problems for you and your baby.  Get help right away if you have symptoms that your health care provider told you to watch for. This information is not intended to replace advice given to you by your health care provider. Make sure you discuss any questions you have with your health care provider. Document Released: 03/06/2000 Document Revised: 11/09/2017 Document Reviewed: 10/14/2015 Elsevier Patient Education  2020 [employer].

## 2018-12-22 NOTE — MAU Note (Signed)
Her BP been elevated, been having HA( no relief with Tylenol), blurred vision and pain in her side (rubbing below rt breast)- feels like a pulled muscle.  Probably been going on for 2 wks.Marland Kitchen

## 2018-12-22 NOTE — Telephone Encounter (Signed)
Patient states she woke up this morning with chest pain and a headache.  She took her blood pressure and it was 131/96.  She took some Tylenol which did not help resolve her headache and has since checked her blood pressure again which was 144/95.  She complains of blurred vision and right upper gastric pain but is "putting it off that the baby is pushing up there and/or maybe a pulled muscle."  Given her symptoms and elevated blood pressures, advised patient to go to [facility] for further evaluation for pre-e.  Pt hesitant but agreeable.  Discussed with patient the possible severity and risks associated with increased blood pressure during pregnancy. Pt verbalized understanding and stated she would go.

## 2018-12-22 NOTE — MAU Provider Note (Signed)
History     CSN: [id number]  Arrival date and time: 12/22/18 1607   First Provider Initiated Contact with Patient 12/22/18 1736      Chief Complaint  Patient presents with  . Headache  . Hypertension  . Blurred Vision  . Abdominal Pain   Ms. Cassidy Braun is a 33 y.o. [phone] at [redacted]w[redacted]d who presents to MAU for preeclampsia evaluation after pt had elevated BP at home of 141/95. Pt called her office and they told her to come to MAU for evaluation.  Pt reports HA that started a couple weeks ago. Pt reports it is an on-going HA. Pt reports she takes "4 pills" of Tylenol, which does not help her HA. Pt was diagnosed with a congential lipoma in 2016. Pt reports she was told to get imaging every 6-51mo, but reports her last imaging was "a few years ago." Results in [facility] from 2018 show no change from scans in 2016. Pt reports she was told this could cause her HA. Pt reports usually her HA are in the back of her head, but this headache is on the sides of her head and her forehead. Pt also reports a stiff neck with her lipoma headaches, but reports she does not have this today. Pt rates HA as 5/10.  Pt also reports blurry vision for a couple of weeks, around the same time as her HA started. Pt reports is currently in right eye, but can be in left eye or both. Pt denies wearing glasses or contacts. Pt states the last time she had blurry vision was in 2016 when she was diagnosed with the lipoma.  Pt reports RUQ pain starting several weeks ago "like I pulled a muscle, sharp pain that comes and goes." Pt reports she has not tried any treatment for this at home.  Pt denies seeing spots, N/V, swelling in face and hands, sudden weight gain. Pt denies chest pain and SOB.  Pt denies constipation, diarrhea, or urinary problems. Pt denies fever, chills, fatigue, sweating or changes in appetite. Pt denies dizziness, light-headedness, weakness.  Pt denies VB, ctx, LOF and reports good  FM.  Current pregnancy problems? Marijuana use, transient HTN Blood Type? B Positive Allergies? NKDA Current medications? Zofran, Robinul, Tylenol (last took this morning), Flinstone gummies Current [facility] & next appt? [facility], next appt 12/28/2018  Pt's friend present for entire visit.   OB History    Gravida  6   Para  3   Term  3   Preterm      AB  2   Living  3     SAB  1   TAB      Ectopic      Multiple      Live Births  3           Past Medical History:  Diagnosis Date  . Anemia   . Dysmenorrhea 07/09/2015  . Frequent headaches    lipoma tumor on brain  . Lipoma of head   . Nausea and vomiting 07/09/2015  . Pelvic pressure in female 07/09/2015  . Recurrent boils   . RUQ pain 07/09/2015    Past Surgical History:  Procedure Laterality Date  . APPENDECTOMY    . CHOLECYSTECTOMY N/A 09/02/2015   Procedure: LAPAROSCOPIC CHOLECYSTECTOMY;  Surgeon: Aviva Signs, MD;  Location: [facility];  Service: General;  Laterality: N/A;  . INDUCED ABORTION  2005    Family History  Problem Relation Age of Onset  . Congestive  Heart Failure Mother   . Asthma Mother   . Diabetes Mother   . Hypertension Mother   . COPD Mother   . Cancer Mother        liver  . Diabetes Father   . Hypertension Father   . Stroke Father   . Fibroids Sister   . Cirrhosis Maternal Grandmother   . Other Sister        ovarian cysts    Social History   Tobacco Use  . Smoking status: Current Every Day Smoker    Packs/day: 0.00    Years: 15.00    Pack years: 0.00    Types: Cigarettes  . Smokeless tobacco: Never Used  . Tobacco comment: smokes 4-5  cig daily  Substance Use Topics  . Alcohol use: Not Currently    Comment: occ  . Drug use: Yes    Types: Marijuana    Allergies: No Known Allergies  Medications Prior to Admission  Medication Sig Dispense Refill Last Dose  . glycopyrrolate (ROBINUL) 2 MG tablet Take 1 tablet (2 mg total) by mouth 2 (two) times daily. (Patient  taking differently: Take 2 mg by mouth as needed. ) 60 tablet 3 Past Month at Unknown time  . Acetaminophen (TYLENOL PO) Take by mouth as needed.   Unknown at Unknown time  . Blood Pressure Monitor MISC For regular home bp monitoring during pregnancy 1 each 0   . ondansetron (ZOFRAN ODT) 4 MG disintegrating tablet Take 1 tablet (4 mg total) by mouth every 8 (eight) hours as needed for nausea or vomiting. 30 tablet 3 Unknown at Unknown time  . ondansetron (ZOFRAN) 8 MG tablet Take 1 tablet (8 mg total) by mouth every 8 (eight) hours as needed for nausea or vomiting. (Patient not taking: Reported on 10/26/2018) 30 tablet 1 Unknown at Unknown time    Review of Systems  Constitutional: Negative for chills, diaphoresis, fatigue and fever.  Eyes: Positive for visual disturbance.  Respiratory: Negative for shortness of breath.   Cardiovascular: Negative for chest pain.  Gastrointestinal: Positive for abdominal pain. Negative for constipation, diarrhea, nausea and vomiting.  Genitourinary: Negative for dysuria, flank pain, frequency, pelvic pain, urgency, vaginal bleeding and vaginal discharge.  Neurological: Positive for headaches. Negative for dizziness, weakness and light-headedness.   Physical Exam   Blood pressure 106/73, pulse 77, temperature 98.2 F (36.8 C), temperature source Oral, resp. rate 18, height 5\' 2"  (1.575 m), weight 77 kg, last menstrual period 05/27/2018, SpO2 100 %.  Patient Vitals for the past 24 hrs:  BP Temp Temp src Pulse Resp SpO2 Height Weight  12/22/18 1819 - - - - - 100 % - -  12/22/18 1815 106/73 - - 77 - 100 % - -  12/22/18 1810 - - - - - 100 % - -  12/22/18 1805 - - - - - 100 % - -  12/22/18 1801 116/67 - - 70 - - - -  12/22/18 1800 - - - - - 100 % - -  12/22/18 1755 - - - - - 99 % - -  12/22/18 1750 - - - - - 100 % - -  12/22/18 1745 131/82 - - 82 - 100 % - -  12/22/18 1740 - - - - - 100 % - -  12/22/18 1735 - - - - - 100 % - -  12/22/18 1730 111/73 - - 80 -  100 % - -  12/22/18 1725 - - - - - 100 % - -  12/22/18 1720 - - - - - 100 % - -  12/22/18 1717 120/75 - - 78 - - - -  12/22/18 1646 129/85 98.2 F (36.8 C) Oral 87 18 100 % 5\' 2"  (1.575 m) 77 kg   Physical Exam  Constitutional: She is oriented to person, place, and time. She appears well-developed and well-nourished. No distress.  HENT:  Head: Normocephalic and atraumatic.  Respiratory: Effort normal.  GI: Soft. She exhibits no distension and no mass. There is abdominal tenderness (mild tenderness in RUQ). There is no rebound and no guarding.  Neurological: She is alert and oriented to person, place, and time.  Skin: Skin is warm and dry. She is not diaphoretic.  Psychiatric: She has a normal mood and affect. Her behavior is normal. Judgment and thought content normal.   Results for orders placed or performed during the hospital encounter of 12/22/18 (from the past 24 hour(s))  CBC     Status: Abnormal   Collection Time: 12/22/18  5:10 PM  Result Value Ref Range   WBC 9.7 4.0 - 10.5 K/uL   RBC 3.69 (L) 3.87 - 5.11 MIL/uL   Hemoglobin 12.2 12.0 - 15.0 g/dL   HCT 34.6 (L) 36.0 - 46.0 %   MCV 93.8 80.0 - 100.0 fL   MCH 33.1 26.0 - 34.0 pg   MCHC 35.3 30.0 - 36.0 g/dL   RDW 11.9 11.5 - 15.5 %   Platelets 302 150 - 400 K/uL   nRBC 0.0 0.0 - 0.2 %  Comprehensive metabolic panel     Status: Abnormal   Collection Time: 12/22/18  5:10 PM  Result Value Ref Range   Sodium 136 135 - 145 mmol/L   Potassium 3.7 3.5 - 5.1 mmol/L   Chloride 104 98 - 111 mmol/L   CO2 23 22 - 32 mmol/L   Glucose, Bld 78 70 - 99 mg/dL   BUN 6 6 - 20 mg/dL   Creatinine, Ser 0.48 0.44 - 1.00 mg/dL   Calcium 8.7 (L) 8.9 - 10.3 mg/dL   Total Protein 6.6 6.5 - 8.1 g/dL   Albumin 2.8 (L) 3.5 - 5.0 g/dL   AST 16 15 - 41 U/L   ALT 15 0 - 44 U/L   Alkaline Phosphatase 139 (H) 38 - 126 U/L   Total Bilirubin 0.4 0.3 - 1.2 mg/dL   GFR calc non Af Amer >60 >60 mL/min   GFR calc Af Amer >60 >60 mL/min   Anion gap 9  5 - 15  Protein / creatinine ratio, urine     Status: None   Collection Time: 12/22/18  5:35 PM  Result Value Ref Range   Creatinine, Urine 72.07 mg/dL   Total Protein, Urine 8 mg/dL   Protein Creatinine Ratio 0.11 0.00 - 0.15 mg/mg[Cre]   US Abdomen Limited Ruq  Result Date: 12/22/2018 CLINICAL DATA:  Right upper quadrant pain. History of a cholecystectomy. EXAM: ULTRASOUND ABDOMEN LIMITED RIGHT UPPER QUADRANT COMPARISON:  None. FINDINGS: Gallbladder: Surgically absent Common bile duct: Diameter: 3 mm Liver: No focal lesion identified. Within normal limits in parenchymal echogenicity. Portal vein is patent on color Doppler imaging with normal direction of blood flow towards the liver. Other: None. IMPRESSION: 1. No acute findings. 2. Status post cholecystectomy.  No other abnormality. Electronically Signed   By: Lajean Manes M.D.   On: 12/22/2018 19:00   MAU Course  Procedures  MDM -preeclampsia work-up with HA/blurry vision/RUQ pain with elevated pressures at home -called and spoke  with Dr. Harolyn Rutherford @551PM . Per Dr. Harolyn Rutherford, schedule outpatient MRI for chronic condition and give 2 Fioricet tabs in MAU for HA. -normal pressures in MAU -CBC: no abnormalities requiring treatment, platelets 302 -CMP: no abnormalities requiring treatment, AST/ALT 16/15 -PCr: 0.11 -RUQ [place]: no acute findings, gallbladder surgically absent -Fioricet x2tabs given, pt reports HA now 2/10 EFM: reactive with variables       -baseline: 135       -variability: moderate       -accels: present, 15x15        -decels: present, few variables       -TOCO: no ctx -pt discharged to home in stable condition  Orders Placed This Encounter  Procedures  . US Abdomen Limited RUQ    Standing Status:   Standing    Number of Occurrences:   1    Order Specific Question:   Symptom/Reason for Exam    Answer:   RUQ pain [id number]  . MR BRAIN WO CONTRAST    Standing Status:   Future    Standing Expiration Date:   02/21/2020     Order Specific Question:   ** REASON FOR EXAM (FREE TEXT)    Answer:   congenital brain lipoma dx 2016, worsening HA/blurry vision    Order Specific Question:   What is the patient's sedation requirement?    Answer:   No Sedation    Order Specific Question:   Does the patient have a pacemaker or implanted devices?    Answer:   No    Order Specific Question:   Preferred imaging location?    Answer:   [facility] (table limit-500 lbs)    Order Specific Question:   Radiology Contrast Protocol - do NOT remove file path    Answer:   [email or web address]  . CBC    Standing Status:   Standing    Number of Occurrences:   1  . Protein / creatinine ratio, urine    Standing Status:   Standing    Number of Occurrences:   1  . Comprehensive metabolic panel    Standing Status:   Standing    Number of Occurrences:   1  . Discharge patient    Order Specific Question:   Discharge disposition    Answer:   01-Home or Self Care [1]    Order Specific Question:   Discharge patient date    Answer:   12/22/2018   Meds ordered this encounter  Medications  . butalbital-acetaminophen-caffeine (FIORICET) 50-325-40 MG per tablet 2 tablet  . butalbital-acetaminophen-caffeine (FIORICET) 50-325-40 MG tablet    Sig: Take 1 tablet by mouth every 6 (six) hours as needed for headache.    Dispense:  20 tablet    Refill:  0    Order Specific Question:   Supervising Provider    Answer:   Clovia Cuff C [[id number]]   Assessment and Plan   1. Pregnancy headache in third trimester   2. RUQ pain   3. Blurry vision, bilateral   4. NST (non-stress test) reactive   5. [redacted] weeks gestation of pregnancy   6. Benign lipomatous neoplasm    Allergies as of 12/22/2018   No Known Allergies     Medication List    STOP taking these medications   ondansetron 8 MG tablet Commonly known as: ZOFRAN     TAKE these medications   Blood Pressure Monitor Misc For regular home bp monitoring during  pregnancy   butalbital-acetaminophen-caffeine 50-325-40 MG tablet  Commonly known as: FIORICET Take 1 tablet by mouth every 6 (six) hours as needed for headache.   glycopyrrolate 2 MG tablet Commonly known as: ROBINUL Take 1 tablet (2 mg total) by mouth 2 (two) times daily. What changed:   when to take this  reasons to take this   ondansetron 4 MG disintegrating tablet Commonly known as: Zofran ODT Take 1 tablet (4 mg total) by mouth every 8 (eight) hours as needed for nausea or vomiting.   TYLENOL PO Take by mouth as needed.      -results reviewed with patient in MAU -outpatient MRI for f/u on congenital lipoma ordered outpatient -RX for fioricet, pt advised not to take concurrently with Tylenol and discussed limitations on number of days of use -pt advised to discuss additional HA treatment with OB -message sent to clinic to call this patient to schedule for a BP check, preferably tomorrow (Friday 12/23/2018), but at the very least the following Monday. Pt will also bring cuff to clinic to check for fit. -strict preeclampsia/return MAU precautions given -pt discharged to home in stable condition  Elmyra Ricks E Edi Gorniak 12/22/2018, 8:08 PM

## 2018-12-27 ENCOUNTER — Encounter: Payer: Self-pay | Admitting: *Deleted

## 2018-12-28 ENCOUNTER — Telehealth (INDEPENDENT_AMBULATORY_CARE_PROVIDER_SITE_OTHER): Payer: Medicaid Other | Admitting: Obstetrics and Gynecology

## 2018-12-28 ENCOUNTER — Encounter: Payer: Self-pay | Admitting: Obstetrics and Gynecology

## 2018-12-28 VITALS — BP 139/98 | HR 89

## 2018-12-28 DIAGNOSIS — Z3483 Encounter for supervision of other normal pregnancy, third trimester: Secondary | ICD-10-CM

## 2018-12-28 DIAGNOSIS — Z3009 Encounter for other general counseling and advice on contraception: Secondary | ICD-10-CM

## 2018-12-28 DIAGNOSIS — Z3A30 30 weeks gestation of pregnancy: Secondary | ICD-10-CM

## 2018-12-28 NOTE — Progress Notes (Signed)
   [place] VIRTUAL VIDEO VISIT ENCOUNTER NOTE  Provider location: [facility] [place] [facility]   I connected with [name] on 12/28/18 at  3:10 PM EDT by MyChart Video Encounter at home and verified that I am speaking with the correct person using two identifiers.   I discussed the limitations, risks, security and privacy concerns of performing an evaluation and management service virtually and the availability of in person appointments. I also discussed with the patient that there may be a patient responsible charge related to this service. The patient expressed understanding and agreed to proceed. Subjective:  Cassidy Braun is a 33 y.o. [phone] at [redacted]w[redacted]d being seen today for ongoing prenatal care.  She is currently monitored for the following issues for this low-risk pregnancy and has Marijuana abuse; Supervision of normal pregnancy; Smoker; Hyperemesis gravidarum; Ptyalism; Congenital brain lipoma ; Request for sterilization; and Unwanted fertility on their problem list.  Patient reports no complaints.  Contractions: Not present. Vag. Bleeding: None.  Movement: Present. Denies any leaking of fluid.   The following portions of the patient's history were reviewed and updated as appropriate: allergies, current medications, past family history, past medical history, past social history, past surgical history and problem list.   Objective:   Vitals:   12/28/18 1513  BP: (!) 139/98  Pulse: 89    Fetal Status:     Movement: Present     General:  Alert, oriented and cooperative. Patient is in no acute distress.  Respiratory: Normal respiratory effort, no problems with respiration noted  Mental Status: Normal mood and affect. Normal behavior. Normal judgment and thought content.  Rest of physical exam deferred due to type of encounter  Imaging: US Abdomen Limited Ruq  Result Date: 12/22/2018 CLINICAL DATA:  Right upper quadrant pain. History  of a cholecystectomy. EXAM: ULTRASOUND ABDOMEN LIMITED RIGHT UPPER QUADRANT COMPARISON:  None. FINDINGS: Gallbladder: Surgically absent Common bile duct: Diameter: 3 mm Liver: No focal lesion identified. Within normal limits in parenchymal echogenicity. Portal vein is patent on color Doppler imaging with normal direction of blood flow towards the liver. Other: None. IMPRESSION: 1. No acute findings. 2. Status post cholecystectomy.  No other abnormality. Electronically Signed   By: Lajean Manes M.D.   On: 12/22/2018 19:00    Assessment and Plan:  Pregnancy: [id number] at [redacted]w[redacted]d 1. Encounter for supervision of other normal pregnancy in third trimester Stable  2. Unwanted fertility Papers signed 12/14/18  Preterm labor symptoms and general obstetric precautions including but not limited to vaginal bleeding, contractions, leaking of fluid and fetal movement were reviewed in detail with the patient. I discussed the assessment and treatment plan with the patient. The patient was provided an opportunity to ask questions and all were answered. The patient agreed with the plan and demonstrated an understanding of the instructions. The patient was advised to call back or seek an in-person office evaluation/go to MAU at [facility] for any urgent or concerning symptoms. Please refer to After Visit Summary for other counseling recommendations.   I provided 8 minutes of face-to-face time during this encounter.  Return in about 2 weeks (around 01/11/2019) for OB visit, face to face.  No future appointments.  Chancy Milroy, MD [facility] [place], [place]

## 2019-01-31 ENCOUNTER — Encounter: Payer: Medicaid Other | Admitting: Women's Health

## 2019-02-02 ENCOUNTER — Encounter: Payer: Medicaid Other | Admitting: Advanced Practice Midwife

## 2019-02-06 ENCOUNTER — Other Ambulatory Visit: Payer: Self-pay

## 2019-02-06 ENCOUNTER — Ambulatory Visit (INDEPENDENT_AMBULATORY_CARE_PROVIDER_SITE_OTHER): Payer: Medicaid Other | Admitting: Women's Health

## 2019-02-06 ENCOUNTER — Encounter: Payer: Self-pay | Admitting: Women's Health

## 2019-02-06 ENCOUNTER — Encounter (HOSPITAL_COMMUNITY): Payer: Self-pay | Admitting: *Deleted

## 2019-02-06 ENCOUNTER — Inpatient Hospital Stay (HOSPITAL_COMMUNITY)
Admission: AD | Admit: 2019-02-06 | Discharge: 2019-02-10 | DRG: 798 | Disposition: A | Discharge status: Home / Self Care | Payer: Medicaid Other | Attending: Obstetrics and Gynecology | Admitting: Obstetrics and Gynecology

## 2019-02-06 VITALS — BP 184/113 | HR 66 | Wt 180.6 lb

## 2019-02-06 DIAGNOSIS — Z3483 Encounter for supervision of other normal pregnancy, third trimester: Secondary | ICD-10-CM

## 2019-02-06 DIAGNOSIS — Z331 Pregnant state, incidental: Secondary | ICD-10-CM

## 2019-02-06 DIAGNOSIS — Z302 Encounter for sterilization: Secondary | ICD-10-CM | POA: Diagnosis not present

## 2019-02-06 DIAGNOSIS — O26843 Uterine size-date discrepancy, third trimester: Secondary | ICD-10-CM | POA: Diagnosis not present

## 2019-02-06 DIAGNOSIS — O1414 Severe pre-eclampsia complicating childbirth: Principal | ICD-10-CM | POA: Diagnosis present

## 2019-02-06 DIAGNOSIS — F1290 Cannabis use, unspecified, uncomplicated: Secondary | ICD-10-CM | POA: Diagnosis present

## 2019-02-06 DIAGNOSIS — O1413 Severe pre-eclampsia, third trimester: Secondary | ICD-10-CM | POA: Diagnosis not present

## 2019-02-06 DIAGNOSIS — Z3009 Encounter for other general counseling and advice on contraception: Secondary | ICD-10-CM | POA: Diagnosis present

## 2019-02-06 DIAGNOSIS — O0993 Supervision of high risk pregnancy, unspecified, third trimester: Secondary | ICD-10-CM

## 2019-02-06 DIAGNOSIS — O99334 Smoking (tobacco) complicating childbirth: Secondary | ICD-10-CM | POA: Diagnosis present

## 2019-02-06 DIAGNOSIS — F17210 Nicotine dependence, cigarettes, uncomplicated: Secondary | ICD-10-CM | POA: Diagnosis present

## 2019-02-06 DIAGNOSIS — O99324 Drug use complicating childbirth: Secondary | ICD-10-CM | POA: Diagnosis present

## 2019-02-06 DIAGNOSIS — Z20828 Contact with and (suspected) exposure to other viral communicable diseases: Secondary | ICD-10-CM | POA: Diagnosis present

## 2019-02-06 DIAGNOSIS — O0990 Supervision of high risk pregnancy, unspecified, unspecified trimester: Secondary | ICD-10-CM

## 2019-02-06 DIAGNOSIS — Z1389 Encounter for screening for other disorder: Secondary | ICD-10-CM

## 2019-02-06 DIAGNOSIS — Z9851 Tubal ligation status: Secondary | ICD-10-CM

## 2019-02-06 DIAGNOSIS — Z3A36 36 weeks gestation of pregnancy: Secondary | ICD-10-CM

## 2019-02-06 LAB — CBC
HCT: 35.5 % — ABNORMAL LOW (ref 36.0–46.0)
Hemoglobin: 12 g/dL (ref 12.0–15.0)
MCH: 31.4 pg (ref 26.0–34.0)
MCHC: 33.8 g/dL (ref 30.0–36.0)
MCV: 92.9 fL (ref 80.0–100.0)
Platelets: 258 10*3/uL (ref 150–400)
RBC: 3.82 MIL/uL — ABNORMAL LOW (ref 3.87–5.11)
RDW: 13.2 % (ref 11.5–15.5)
WBC: 9.2 10*3/uL (ref 4.0–10.5)
nRBC: 0 % (ref 0.0–0.2)

## 2019-02-06 LAB — COMPREHENSIVE METABOLIC PANEL
ALT: 17 U/L (ref 0–44)
AST: 19 U/L (ref 15–41)
Albumin: 2.8 g/dL — ABNORMAL LOW (ref 3.5–5.0)
Alkaline Phosphatase: 182 U/L — ABNORMAL HIGH (ref 38–126)
Anion gap: 13 (ref 5–15)
BUN: 5 mg/dL — ABNORMAL LOW (ref 6–20)
CO2: 21 mmol/L — ABNORMAL LOW (ref 22–32)
Calcium: 8.7 mg/dL — ABNORMAL LOW (ref 8.9–10.3)
Chloride: 102 mmol/L (ref 98–111)
Creatinine, Ser: 0.51 mg/dL (ref 0.44–1.00)
GFR calc Af Amer: >60 mL/min (ref >60)
GFR calc non Af Amer: >60 mL/min (ref >60)
Glucose, Bld: 75 mg/dL (ref 70–99)
Potassium: 3.5 mmol/L (ref 3.5–5.1)
Sodium: 136 mmol/L (ref 135–145)
Total Bilirubin: 0.5 mg/dL (ref 0.3–1.2)
Total Protein: 6.9 g/dL (ref 6.5–8.1)

## 2019-02-06 LAB — PROTEIN / CREATININE RATIO, URINE
Creatinine, Urine: 36.76 mg/dL
Protein Creatinine Ratio: 0.65 mg/mg{Cre} — ABNORMAL HIGH (ref 0.00–0.15)
Total Protein, Urine: 24 mg/dL

## 2019-02-06 LAB — POCT URINALYSIS DIPSTICK OB
Blood, UA: NEGATIVE
Glucose, UA: NEGATIVE
Ketones, UA: NEGATIVE
Leukocytes, UA: NEGATIVE
Nitrite, UA: NEGATIVE

## 2019-02-06 LAB — ABO/RH: ABO/RH(D): B POS

## 2019-02-06 LAB — TYPE AND SCREEN
ABO/RH(D): B POS
Antibody Screen: NEGATIVE

## 2019-02-06 LAB — SARS CORONAVIRUS 2 (TAT 6-24 HRS): SARS Coronavirus 2: NEGATIVE

## 2019-02-06 MED ORDER — FENTANYL CITRATE (PF) 100 MCG/2ML IJ SOLN
100.0000 ug | INTRAMUSCULAR | Status: DC | PRN
  Administered 2019-02-06: 100 ug via INTRAVENOUS
  Filled 2019-02-06: qty 2

## 2019-02-06 MED ORDER — EPHEDRINE 5 MG/ML INJ: 10.0000 mg | INTRAVENOUS | Status: DC | PRN

## 2019-02-06 MED ORDER — FENTANYL-BUPIVACAINE-NACL 0.5-0.125-0.9 MG/250ML-% EP SOLN
12.0000 mL/h | EPIDURAL | Status: DC | PRN
  Filled 2019-02-06: qty 250

## 2019-02-06 MED ORDER — PHENYLEPHRINE 40 MCG/ML (10ML) SYRINGE FOR IV PUSH (FOR BLOOD PRESSURE SUPPORT): 80.0000 ug | PREFILLED_SYRINGE | INTRAVENOUS | Status: DC | PRN

## 2019-02-06 MED ORDER — OXYCODONE-ACETAMINOPHEN 5-325 MG PO TABS: 2.0000 | ORAL_TABLET | ORAL | Status: DC | PRN

## 2019-02-06 MED ORDER — MISOPROSTOL 50MCG HALF TABLET
50.0000 ug | ORAL_TABLET | ORAL | Status: DC | PRN
  Administered 2019-02-06 (×3): 50 ug via BUCCAL
  Filled 2019-02-06 (×3): qty 1

## 2019-02-06 MED ORDER — TERBUTALINE SULFATE 1 MG/ML IJ SOLN: 0.2500 mg | Freq: Once | INTRAMUSCULAR | Status: DC | PRN

## 2019-02-06 MED ORDER — LABETALOL HCL 5 MG/ML IV SOLN
40.0000 mg | INTRAVENOUS | Status: DC | PRN
  Administered 2019-02-06: 40 mg via INTRAVENOUS
  Filled 2019-02-06: qty 8

## 2019-02-06 MED ORDER — MAGNESIUM SULFATE BOLUS VIA INFUSION
4.0000 g | Freq: Once | INTRAVENOUS | Status: AC
  Administered 2019-02-06: 4 g via INTRAVENOUS
  Filled 2019-02-06: qty 1000

## 2019-02-06 MED ORDER — SODIUM CHLORIDE 0.9 % IV SOLN
5.0000 10*6.[IU] | Freq: Once | INTRAVENOUS | Status: AC
  Administered 2019-02-06: 13:00:00 5 10*6.[IU] via INTRAVENOUS
  Filled 2019-02-06: qty 5

## 2019-02-06 MED ORDER — LACTATED RINGERS IV SOLN
INTRAVENOUS | Status: DC
  Administered 2019-02-06: 13:00:00 via INTRAVENOUS

## 2019-02-06 MED ORDER — LACTATED RINGERS IV SOLN
500.0000 mL | Freq: Once | INTRAVENOUS | Status: AC
  Administered 2019-02-07: 500 mL via INTRAVENOUS

## 2019-02-06 MED ORDER — ONDANSETRON HCL 4 MG/2ML IJ SOLN
4.0000 mg | Freq: Four times a day (QID) | INTRAMUSCULAR | Status: DC | PRN
  Administered 2019-02-06: 4 mg via INTRAVENOUS
  Filled 2019-02-06: qty 2

## 2019-02-06 MED ORDER — BETAMETHASONE SOD PHOS & ACET 6 (3-3) MG/ML IJ SUSP
12.0000 mg | INTRAMUSCULAR | Status: DC
  Administered 2019-02-06: 12 mg via INTRAMUSCULAR
  Filled 2019-02-06: qty 5

## 2019-02-06 MED ORDER — PENICILLIN G POT IN DEXTROSE 60000 UNIT/ML IV SOLN
3.0000 10*6.[IU] | INTRAVENOUS | Status: DC
  Administered 2019-02-06 – 2019-02-07 (×3): 3 10*6.[IU] via INTRAVENOUS
  Filled 2019-02-06 (×5): qty 50

## 2019-02-06 MED ORDER — MAGNESIUM SULFATE 40 GM/1000ML IV SOLN
2.0000 g/h | INTRAVENOUS | Status: DC
  Administered 2019-02-07: 2 g/h via INTRAVENOUS
  Filled 2019-02-06 (×2): qty 1000

## 2019-02-06 MED ORDER — OXYCODONE-ACETAMINOPHEN 5-325 MG PO TABS
1.0000 | ORAL_TABLET | ORAL | Status: DC | PRN
  Administered 2019-02-06: 1 via ORAL
  Filled 2019-02-06: qty 1

## 2019-02-06 MED ORDER — DIPHENHYDRAMINE HCL 50 MG/ML IJ SOLN: 12.5000 mg | INTRAMUSCULAR | Status: DC | PRN

## 2019-02-06 MED ORDER — LABETALOL HCL 5 MG/ML IV SOLN
INTRAVENOUS | Status: AC
  Filled 2019-02-06: qty 4

## 2019-02-06 MED ORDER — LABETALOL HCL 5 MG/ML IV SOLN
80.0000 mg | INTRAVENOUS | Status: DC | PRN
  Administered 2019-02-06: 80 mg via INTRAVENOUS
  Filled 2019-02-06: qty 16

## 2019-02-06 MED ORDER — OXYTOCIN 40 UNITS IN NORMAL SALINE INFUSION - SIMPLE MED
2.5000 [IU]/h | INTRAVENOUS | Status: DC
  Administered 2019-02-07: 2.5 [IU]/h via INTRAVENOUS
  Filled 2019-02-06: qty 1000

## 2019-02-06 MED ORDER — LIDOCAINE HCL (PF) 1 % IJ SOLN: 30.0000 mL | INTRAMUSCULAR | Status: DC | PRN

## 2019-02-06 MED ORDER — OXYTOCIN BOLUS FROM INFUSION
500.0000 mL | Freq: Once | INTRAVENOUS | Status: AC
  Administered 2019-02-07: 500 mL via INTRAVENOUS

## 2019-02-06 MED ORDER — SOD CITRATE-CITRIC ACID 500-334 MG/5ML PO SOLN: 30.0000 mL | ORAL | Status: DC | PRN

## 2019-02-06 MED ORDER — LABETALOL HCL 5 MG/ML IV SOLN
20.0000 mg | INTRAVENOUS | Status: DC | PRN
  Administered 2019-02-06 – 2019-02-07 (×3): 20 mg via INTRAVENOUS
  Filled 2019-02-06 (×3): qty 4

## 2019-02-06 MED ORDER — HYDRALAZINE HCL 20 MG/ML IJ SOLN
10.0000 mg | INTRAMUSCULAR | Status: DC | PRN
  Administered 2019-02-06: 10 mg via INTRAVENOUS
  Filled 2019-02-06: qty 1

## 2019-02-06 MED ORDER — FENTANYL CITRATE (PF) 100 MCG/2ML IJ SOLN
100.0000 ug | Freq: Once | INTRAMUSCULAR | Status: AC
  Administered 2019-02-06: 100 ug via INTRAVENOUS
  Filled 2019-02-06: qty 2

## 2019-02-06 MED ORDER — LACTATED RINGERS IV SOLN: 500.0000 mL | INTRAVENOUS | Status: DC | PRN

## 2019-02-06 MED ORDER — ACETAMINOPHEN 325 MG PO TABS
650.0000 mg | ORAL_TABLET | ORAL | Status: DC | PRN
  Administered 2019-02-06 – 2019-02-07 (×2): 650 mg via ORAL
  Filled 2019-02-06 (×2): qty 2

## 2019-02-06 NOTE — Progress Notes (Signed)
Cassidy Braun is a 33 y.o. [phone] at [redacted]w[redacted]d admitted for induction of labor due to Pre Eclampsia with severe symptoms(visual, headaches, severe range BPs - 180's/110's).  Subjective: Patient semi uncomfortable. Feeling intermittent ctx.   Objective: BP 135/89   Pulse 82   Temp 97.9 F (36.6 C) (Oral)   Resp 16   Ht 5\' 2"  (1.575 m)   Wt 81.6 kg   LMP 05/27/2018 (Exact Date)   BMI 32.92 kg/m  Total I/O In: 1215 [P.O.:740; I.V.:375; IV Piggyback:100] Out: 650 [Urine:650]  FHT:  FHR: 120 bpm, variability: moderate,  accelerations:  Present,  decelerations:  Absent UC:  Infrequent   SVE:   Dilation: 3.5 Effacement (%): 50 Station: -2 Exam by:: Catie Conley [employer]   Labs: Lab Results  Component Value Date   WBC 9.2 02/06/2019   HGB 12.0 02/06/2019   HCT 35.5 (L) 02/06/2019   MCV 92.9 02/06/2019   PLT 258 02/06/2019    Assessment / Plan: Induction of Labor due to preeclampsia with severe symptoms  Labor: S/p cyto x 2 and Cooks catheter. Will give 3rd Cytotec due to cervical thickness and lack of frequent ctx and then consider AROM, Pit as needed. Anticipate SVD. Fetal Wellbeing:  Category I Pain Control:  IV pain meds, epidural as desired Pre-eclampsia: Continue Mag, IV anti-hypertensive protocol (s/p labetalol and hydralazine). Most recent BPs not in severe range since 1650. I/D:  GBS unknown, culture pending. PCN until culture results.  Barrington Ellison, MD [facility] Family Medicine Fellow, [facility] for [employer], [place]

## 2019-02-06 NOTE — Progress Notes (Addendum)
Cassidy Braun is a 33 y.o. [phone] at [redacted]w[redacted]d admitted for induction of labor due to Pre Eclampsia with severe symptoms(visual, headaches, severe range BPs - 180's/110's).  Subjective: Patient uncomfortable, but not feeling any contractions. Continues to have headache. Feeling baby move, no LOF.  Objective: BP 139/87   Pulse 79   Temp 98.2 F (36.8 C) (Oral)   Resp 17   Ht 5\' 2"  (1.575 m)   Wt 81.6 kg   LMP 05/27/2018 (Exact Date)   BMI 32.92 kg/m  Total I/O In: 1844.2 [P.O.:720; I.V.:812.5; IV Piggyback:311.7] Out: 2050 [Urine:2050]  FHT:  FHR: 130 bpm, variability: moderate,  accelerations:  Present,  decelerations:  Absent UC:   none  SVE:   Dilation: Fingertip Effacement (%): 50 Station: -3 Exam by:: Dr. Darene Lamer   Labs: Lab Results  Component Value Date   WBC 9.2 02/06/2019   HGB 12.0 02/06/2019   HCT 35.5 (L) 02/06/2019   MCV 92.9 02/06/2019   PLT 258 02/06/2019    Assessment / Plan: Induction of Labor due to preeclampsia with severe symptoms  Labor: S/p cyto x 1, with no real cervical change. Cooks catheter inserted, Cytotec x 2 given.  Consider more cyto, AROM, and Pitocin as appropriate Fetal Wellbeing:  Category I Pain Control:  IV pain meds, epidural as desired Pre-eclampsia: Continue Mag, IV anti-hypertensive protocol (s/p labetalol and hydralazine). Most recent BPs not in severe range. May consider adding PO antihypertensives if needed I/D:  GBS unknown, culture pending. PCN until culture results. Anticipated MOD:  Vaginal delivery, CS as appropriate  Carollee Leitz MD PGY1, Family Practice 02/06/2019, 6:27 PM    GME ATTESTATION:  I saw and evaluated the patient. I agree with the findings and the plan of care as documented in the resident's note.  Merilyn Baba, DO OB Fellow, [facility] 02/06/2019 6:39 PM

## 2019-02-06 NOTE — Progress Notes (Signed)
   HIGH-RISK PREGNANCY VISIT Patient name: Cassidy Braun MRN [id number]  Date of birth: 09-15-1985 Chief Complaint:   Routine Prenatal Visit  History of Present Illness:   Cassidy Braun is a 33 y.o. [phone] female at [redacted]w[redacted]d with an Estimated Date of Delivery: 03/03/19 being seen today for ongoing management of a high-risk pregnancy complicated by severe pre-e dx today.  Today she reports headaches x 1wk not really r/b apap, currently rates 8/10. Blurred/cloudy vision. Occ sharp RUQ pain. No h/o pre-e, this is new FOB. Contractions: Not present. Vag. Bleeding: None.  Movement: Present. denies leaking of fluid.  Review of Systems:   Pertinent items are noted in HPI Denies abnormal vaginal discharge w/ itching/odor/irritation, headaches, visual changes, shortness of breath, chest pain, abdominal pain, severe nausea/vomiting, or problems with urination or bowel movements unless otherwise stated above. Pertinent History Reviewed:  Reviewed past medical,surgical, social, obstetrical and family history.  Reviewed problem list, medications and allergies. Physical Assessment:   Vitals:   02/06/19 0839 02/06/19 0844  BP: (!) 178/113 (!) 184/113  Pulse: 68 66  Weight: 180 lb 9.6 oz (81.9 kg)   Body mass index is 33.03 kg/m.           Physical Examination:   General appearance: looks like she doesn't feel well  Mental status: alert, oriented to person, place, and time  Skin: warm & dry   Extremities: Edema: Trace, DTRs 3+, clonus: none noted   Cardiovascular: normal heart rate noted  Respiratory: normal respiratory effort, no distress  Abdomen: gravid, soft, non-tender  Pelvic: Cervical exam deferred         Fetal Status:   Fundal Height: 30 cm Movement: Present Presentation: Vertex  Fetal Surveillance Testing today: NST: FHR baseline 130 bpm, Variability: moderate, Accelerations:present, Decelerations:  Absent= Cat 1/Reactive Toco: irregular, not perceived by pt    Chaperone: n/a     Results for orders placed or performed in visit on 02/06/19 (from the past 24 hour(s))  POC Urinalysis Dipstick OB   Collection Time: 02/06/19  8:42 AM  Result Value Ref Range   Color, UA     Clarity, UA     Glucose, UA Negative Negative   Bilirubin, UA     Ketones, UA n    Spec Grav, UA     Blood, UA n    pH, UA     POC,PROTEIN,UA Large (3+) Negative, Trace, Small (1+), Moderate (2+), Large (3+), 4+   Urobilinogen, UA     Nitrite, UA n    Leukocytes, UA Negative Negative   Appearance     Odor      Assessment & Plan:  1) High-risk pregnancy [id number] at [redacted]w[redacted]d with an Estimated Date of Delivery: 03/03/19   2) Severe pre-e, unstable, severe range bp's, 3+ proteinuria, headaches unrelieved w/ apap, RUQ pain, hyperreflexic. Notified Dr. Darene Lamer and L&D charge of direct admit for IOL  3) Uterine size <dates, reports all her babies were 6lbs at 37-39wks  Meds: No orders of the defined types were placed in this encounter.  Labs/procedures today: nst, declines flu/tdap  Treatment Plan:  To [facility] for direct admit/IOL for severe pre-e  Reviewed: IOL process, pre-e  Follow-up: No follow-ups on file.  Orders Placed This Encounter  Procedures  . POC Urinalysis Dipstick OB   Roma Schanz CNM, [facility] 02/06/2019 9:58 AM

## 2019-02-06 NOTE — Progress Notes (Signed)
Saw patient as there was a prolonged deceleration into the 50-60s. Deceleration resolved without any interventions. Mom only complains of headache, will try Precocet.

## 2019-02-06 NOTE — H&P (Addendum)
OBSTETRIC ADMISSION HISTORY AND PHYSICAL  Cassidy Braun is a 33 y.o. female [phone] with IUP at [redacted]w[redacted]d presenting for preeclampsia with severe features and induction of labor. She was sent over from the office for proteinuria and severe range BPs (178/113, repeat 184/113). She reports +FMs. No LOF, VB, or peripheral edema. She does endorse blurry vision, headaches x 1 week, and RUQ pain. At the office she was noted to be hyperreflexic. She plans on bottle feeding. She requests tubal ligation for birth control.  Dating: By [place] --->  Estimated Date of Delivery: 03/03/19  Sono:   @ [redacted]w[redacted]d, CWD, normal anatomy,  presentation, 430g, 34%ile   Prenatal History/Complications: Severe pre-eclampsia Marijuana abuse Smoker Hyperemesis gravidarium  Past Medical History: Past Medical History:  Diagnosis Date  . Anemia   . Dysmenorrhea 07/09/2015  . Frequent headaches    lipoma tumor on brain  . Lipoma of head   . Nausea and vomiting 07/09/2015  . Pelvic pressure in female 07/09/2015  . Recurrent boils   . RUQ pain 07/09/2015    Past Surgical History: Past Surgical History:  Procedure Laterality Date  . APPENDECTOMY    . CHOLECYSTECTOMY N/A 09/02/2015   Procedure: LAPAROSCOPIC CHOLECYSTECTOMY;  Surgeon: Aviva Signs, MD;  Location: [facility];  Service: General;  Laterality: N/A;  . INDUCED ABORTION  2005    Obstetrical History: OB History    Gravida  6   Para  3   Term  3   Preterm      AB  2   Living  3     SAB  1   TAB      Ectopic      Multiple      Live Births  3           Social History: Social History   Socioeconomic History  . Marital status: Single    Spouse name: Not on file  . Number of children: 3  . Years of education: Not on file  . Highest education level: Not on file  Occupational History  . Not on file  Social Needs  . Financial resource strain: Not on file  . Food insecurity    Worry: Not on file    Inability: Not on file  .  Transportation needs    Medical: Not on file    Non-medical: Not on file  Tobacco Use  . Smoking status: Current Every Day Smoker    Packs/day: 0.00    Years: 15.00    Pack years: 0.00    Types: Cigarettes  . Smokeless tobacco: Never Used  . Tobacco comment: smokes 4-5  cig daily  Substance and Sexual Activity  . Alcohol use: Not Currently    Comment: occ  . Drug use: Yes    Types: Marijuana  . Sexual activity: Yes    Birth control/protection: None  Lifestyle  . Physical activity    Days per week: Not on file    Minutes per session: Not on file  . Stress: Not on file  Relationships  . Social Herbalist on phone: Not on file    Gets together: Not on file    Attends religious service: Not on file    Active member of club or organization: Not on file    Attends meetings of clubs or organizations: Not on file    Relationship status: Not on file  Other Topics Concern  . Not on file  Social History Narrative  .  Not on file    Family History: Family History  Problem Relation Age of Onset  . Congestive Heart Failure Mother   . Asthma Mother   . Diabetes Mother   . Hypertension Mother   . COPD Mother   . Cancer Mother        liver  . Diabetes Father   . Hypertension Father   . Stroke Father   . Fibroids Sister   . Cirrhosis Maternal Grandmother   . Other Sister        ovarian cysts    Allergies: No Known Allergies  Medications Prior to Admission  Medication Sig Dispense Refill Last Dose  . Acetaminophen (TYLENOL PO) Take by mouth as needed.     . Blood Pressure Monitor MISC For regular home bp monitoring during pregnancy 1 each 0   . butalbital-acetaminophen-caffeine (FIORICET) 50-325-40 MG tablet Take 1 tablet by mouth every 6 (six) hours as needed for headache. 20 tablet 0   . glycopyrrolate (ROBINUL) 2 MG tablet Take 1 tablet (2 mg total) by mouth 2 (two) times daily. (Patient taking differently: Take 2 mg by mouth as needed. ) 60 tablet 3   .  ondansetron (ZOFRAN ODT) 4 MG disintegrating tablet Take 1 tablet (4 mg total) by mouth every 8 (eight) hours as needed for nausea or vomiting. 30 tablet 3      Review of Systems   All systems reviewed and negative except as stated in HPI  Blood pressure (!) 153/102, pulse 67, temperature 99.5 F (37.5 C), temperature source Oral, resp. rate 18, height 5\' 2"  (1.575 m), weight 81.6 kg, last menstrual period 05/27/2018. General appearance: alert, cooperative and appears stated age Lungs: regular rate and effort Heart: regular rate  Abdomen: soft, non-tender Extremities: Homans sign is negative, no sign of DVT Presentation: cephalic Fetal monitoringBaseline: 140 bpm, moderate variability, accels, no decels Uterine activityNone Dilation: Fingertip Effacement (%): Thick Station: -3 Exam by:: Dr. Darene Lamer   Prenatal labs: ABO, Rh: --/--/B POS (11/16 1244) Antibody: NEG (11/16 1244) Rubella: 2.90 (06/02 1342) RPR: Non Reactive (09/30 0906)  HBsAg: Negative (06/02 1342)  HIV: Non Reactive (09/30 0906)  GBS:   unknown 2 hr GTT 123  Prenatal Transfer Tool  Maternal Diabetes: No Genetic Screening: Normal Maternal Ultrasounds/Referrals: Other: Fetal Ultrasounds or other Referrals:  Referred to Materal Fetal Medicine  Maternal Substance Abuse:  Yes:  Type: Smoker, Marijuana Significant Maternal Medications:  None Significant Maternal Lab Results: None GBS unknown  Results for orders placed or performed during the hospital encounter of 02/06/19 (from the past 24 hour(s))  CBC   Collection Time: 02/06/19 12:44 PM  Result Value Ref Range   WBC 9.2 4.0 - 10.5 K/uL   RBC 3.82 (L) 3.87 - 5.11 MIL/uL   Hemoglobin 12.0 12.0 - 15.0 g/dL   HCT 35.5 (L) 36.0 - 46.0 %   MCV 92.9 80.0 - 100.0 fL   MCH 31.4 26.0 - 34.0 pg   MCHC 33.8 30.0 - 36.0 g/dL   RDW 13.2 11.5 - 15.5 %   Platelets 258 150 - 400 K/uL   nRBC 0.0 0.0 - 0.2 %  Type and screen [place]    Collection Time: 02/06/19 12:44 PM  Result Value Ref Range   ABO/RH(D) B POS    Antibody Screen NEG    Sample Expiration      02/09/2019,2359 Performed at [facility], [address], [place], [place] [address]   Results for orders  placed or performed in visit on 02/06/19 (from the past 24 hour(s))  POC Urinalysis Dipstick OB   Collection Time: 02/06/19  8:42 AM  Result Value Ref Range   Color, UA     Clarity, UA     Glucose, UA Negative Negative   Bilirubin, UA     Ketones, UA n    Spec Grav, UA     Blood, UA n    pH, UA     POC,PROTEIN,UA Large (3+) Negative, Trace, Small (1+), Moderate (2+), Large (3+), 4+   Urobilinogen, UA     Nitrite, UA n    Leukocytes, UA Negative Negative   Appearance     Odor      Patient Active Problem List   Diagnosis Date Noted  . Severe pre-eclampsia 02/06/2019  . Encounter for elective induction of labor 02/06/2019  . Unwanted fertility 12/28/2018  . Request for sterilization 12/14/2018  . 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 Supervision of normal pregnancy 08/23/2018  . Smoker 08/23/2018  . Hyperemesis gravidarum 08/23/2018  . Ptyalism 08/23/2018  . Marijuana abuse 08/12/2018    Assessment: Cassidy Braun is a 33 y.o. [id number] at [redacted]w[redacted]d here for IOL 2/2 pre-eclampsia with severe features (symptoms: vision, headache, RUQ pain; severe range BP, proteinuria).  1. Labor: Bishop Score 2, start with Buccal Cytotec for cervical ripening. Consider Foley Bulb, AROM and Pitocin as appropriate.   2. Pre-E w/ severe features: Start mag, continue 24 hours post delivery. Labetalol prn, Hydralazine prn, per dept protocol.   3. FWB: Cat tracing #1, EFW 5lbs by Leopold's, vertex 4. Pain: Percocet 5-325mg  q4h prn 5. GBS: PCN  6. Preterm (36.3 EGA): BMZ x1, x2 if still pregnant in 24 hours.   Plan: Risks and benefits of induction were reviewed, including failure of method, prolonged labor, need for further intervention, risk of cesarean.   Patient and family seem to understand these risks and wish to proceed. Options of cytotec, foley bulb, AROM, and pitocin reviewed, with use of each discussed.  Anticipate vaginal delivery, CS as appropriate.  Carollee Leitz, MD  02/06/2019, 1:59 PM  GME ATTESTATION:  I saw and evaluated the patient. I agree with the findings and the plan of care as documented in the resident's note.  Merilyn Baba, DO OB Fellow, [facility] 02/06/2019 3:15 PM

## 2019-02-07 ENCOUNTER — Inpatient Hospital Stay (HOSPITAL_COMMUNITY): Payer: Medicaid Other | Admitting: Anesthesiology

## 2019-02-07 ENCOUNTER — Encounter (HOSPITAL_COMMUNITY)
Admission: AD | Disposition: A | Discharge status: Home / Self Care | Payer: Self-pay | Source: Home / Self Care | Attending: Obstetrics and Gynecology

## 2019-02-07 ENCOUNTER — Encounter (HOSPITAL_COMMUNITY): Payer: Self-pay | Admitting: *Deleted

## 2019-02-07 DIAGNOSIS — O1414 Severe pre-eclampsia complicating childbirth: Secondary | ICD-10-CM

## 2019-02-07 DIAGNOSIS — Z302 Encounter for sterilization: Secondary | ICD-10-CM

## 2019-02-07 DIAGNOSIS — Z3A36 36 weeks gestation of pregnancy: Secondary | ICD-10-CM

## 2019-02-07 HISTORY — PX: TUBAL LIGATION: SHX77

## 2019-02-07 LAB — CBC
HCT: 34.9 % — ABNORMAL LOW (ref 36.0–46.0)
Hemoglobin: 12.1 g/dL (ref 12.0–15.0)
MCH: 31.2 pg (ref 26.0–34.0)
MCHC: 34.7 g/dL (ref 30.0–36.0)
MCV: 89.9 fL (ref 80.0–100.0)
Platelets: 241 10*3/uL (ref 150–400)
RBC: 3.88 MIL/uL (ref 3.87–5.11)
RDW: 12.9 % (ref 11.5–15.5)
WBC: 12.9 10*3/uL — ABNORMAL HIGH (ref 4.0–10.5)
nRBC: 0 % (ref 0.0–0.2)

## 2019-02-07 LAB — RPR: RPR Ser Ql: NONREACTIVE

## 2019-02-07 SURGERY — LIGATION, FALLOPIAN TUBE, POSTPARTUM
Anesthesia: Epidural | Site: Abdomen | Laterality: Bilateral | Wound class: Clean Contaminated

## 2019-02-07 MED ORDER — ACETAMINOPHEN 325 MG PO TABS
650.0000 mg | ORAL_TABLET | Freq: Four times a day (QID) | ORAL | Status: DC | PRN
  Administered 2019-02-07 – 2019-02-09 (×2): 650 mg via ORAL
  Filled 2019-02-07 (×2): qty 2

## 2019-02-07 MED ORDER — LIDOCAINE-EPINEPHRINE (PF) 2 %-1:200000 IJ SOLN
INTRAMUSCULAR | Status: DC | PRN
  Administered 2019-02-07: 3 mL via EPIDURAL
  Administered 2019-02-07: 10 mL via EPIDURAL
  Administered 2019-02-07: 5 mL via EPIDURAL

## 2019-02-07 MED ORDER — LIDOCAINE-EPINEPHRINE (PF) 2 %-1:200000 IJ SOLN
INTRAMUSCULAR | Status: AC
  Filled 2019-02-07: qty 10

## 2019-02-07 MED ORDER — DIPHENHYDRAMINE HCL 25 MG PO CAPS: 25.0000 mg | ORAL_CAPSULE | Freq: Four times a day (QID) | ORAL | Status: DC | PRN

## 2019-02-07 MED ORDER — OXYCODONE HCL 5 MG/5ML PO SOLN: 5.0000 mg | Freq: Once | ORAL | Status: DC | PRN

## 2019-02-07 MED ORDER — MAGNESIUM SULFATE 40 GM/1000ML IV SOLN
2.0000 g/h | INTRAVENOUS | Status: AC
  Administered 2019-02-08: 2 g/h via INTRAVENOUS
  Filled 2019-02-07: qty 1000

## 2019-02-07 MED ORDER — MIDAZOLAM HCL 2 MG/2ML IJ SOLN
INTRAMUSCULAR | Status: AC
  Filled 2019-02-07: qty 2

## 2019-02-07 MED ORDER — SIMETHICONE 80 MG PO CHEW: 80.0000 mg | CHEWABLE_TABLET | ORAL | Status: DC | PRN

## 2019-02-07 MED ORDER — SOD CITRATE-CITRIC ACID 500-334 MG/5ML PO SOLN
30.0000 mL | Freq: Once | ORAL | Status: AC
  Administered 2019-02-07: 30 mL via ORAL
  Filled 2019-02-07: qty 30

## 2019-02-07 MED ORDER — COCONUT OIL OIL: 1.0000 "application " | TOPICAL_OIL | Status: DC | PRN

## 2019-02-07 MED ORDER — HYDROMORPHONE HCL 1 MG/ML IJ SOLN
0.2500 mg | INTRAMUSCULAR | Status: DC | PRN
  Administered 2019-02-07 (×2): 0.5 mg via INTRAVENOUS

## 2019-02-07 MED ORDER — OXYTOCIN 40 UNITS IN NORMAL SALINE INFUSION - SIMPLE MED
1.0000 m[IU]/min | INTRAVENOUS | Status: DC
  Administered 2019-02-07: 2 m[IU]/min via INTRAVENOUS

## 2019-02-07 MED ORDER — ENALAPRIL MALEATE 5 MG PO TABS
10.0000 mg | ORAL_TABLET | Freq: Every day | ORAL | Status: DC
  Administered 2019-02-08: 10 mg via ORAL
  Filled 2019-02-07 (×2): qty 2

## 2019-02-07 MED ORDER — FENTANYL CITRATE (PF) 100 MCG/2ML IJ SOLN
INTRAMUSCULAR | Status: AC
  Filled 2019-02-07: qty 2

## 2019-02-07 MED ORDER — SODIUM CHLORIDE 0.9 % IR SOLN
Status: DC | PRN
  Administered 2019-02-07: 1

## 2019-02-07 MED ORDER — BUPIVACAINE HCL (PF) 0.25 % IJ SOLN
INTRAMUSCULAR | Status: AC
  Filled 2019-02-07: qty 10

## 2019-02-07 MED ORDER — LIDOCAINE HCL (PF) 1 % IJ SOLN
INTRAMUSCULAR | Status: DC | PRN
  Administered 2019-02-07: 2 mL via EPIDURAL
  Administered 2019-02-07: 10 mL via EPIDURAL

## 2019-02-07 MED ORDER — ONDANSETRON HCL 4 MG/2ML IJ SOLN: 4.0000 mg | INTRAMUSCULAR | Status: DC | PRN

## 2019-02-07 MED ORDER — ONDANSETRON HCL 4 MG/2ML IJ SOLN
INTRAMUSCULAR | Status: AC
  Filled 2019-02-07: qty 2

## 2019-02-07 MED ORDER — PRENATAL MULTIVITAMIN CH
1.0000 | ORAL_TABLET | Freq: Every day | ORAL | Status: DC
  Administered 2019-02-08 – 2019-02-09 (×2): 1 via ORAL
  Filled 2019-02-07 (×2): qty 1

## 2019-02-07 MED ORDER — TERBUTALINE SULFATE 1 MG/ML IJ SOLN: 0.2500 mg | Freq: Once | INTRAMUSCULAR | Status: DC | PRN

## 2019-02-07 MED ORDER — ONDANSETRON HCL 4 MG PO TABS: 4.0000 mg | ORAL_TABLET | ORAL | Status: DC | PRN

## 2019-02-07 MED ORDER — LIDOCAINE-EPINEPHRINE (PF) 2 %-1:200000 IJ SOLN
INTRAMUSCULAR | Status: AC
  Filled 2019-02-07: qty 20

## 2019-02-07 MED ORDER — TETANUS-DIPHTH-ACELL PERTUSSIS 5-2.5-18.5 LF-MCG/0.5 IM SUSP: 0.5000 mL | Freq: Once | INTRAMUSCULAR | Status: DC

## 2019-02-07 MED ORDER — SENNOSIDES-DOCUSATE SODIUM 8.6-50 MG PO TABS
2.0000 | ORAL_TABLET | ORAL | Status: DC
  Administered 2019-02-07 – 2019-02-10 (×3): 2 via ORAL
  Filled 2019-02-07 (×3): qty 2

## 2019-02-07 MED ORDER — MEASLES, MUMPS & RUBELLA VAC IJ SOLR: 0.5000 mL | Freq: Once | INTRAMUSCULAR | Status: DC

## 2019-02-07 MED ORDER — IBUPROFEN 600 MG PO TABS
600.0000 mg | ORAL_TABLET | Freq: Three times a day (TID) | ORAL | Status: DC | PRN
  Administered 2019-02-07 – 2019-02-10 (×6): 600 mg via ORAL
  Filled 2019-02-07 (×6): qty 1

## 2019-02-07 MED ORDER — BUPIVACAINE HCL (PF) 0.25 % IJ SOLN
INTRAMUSCULAR | Status: DC | PRN
  Administered 2019-02-07: 10 mL

## 2019-02-07 MED ORDER — DEXAMETHASONE SODIUM PHOSPHATE 10 MG/ML IJ SOLN
INTRAMUSCULAR | Status: DC | PRN
  Administered 2019-02-07: 10 mg via INTRAVENOUS

## 2019-02-07 MED ORDER — DIBUCAINE (PERIANAL) 1 % EX OINT: 1.0000 "application " | TOPICAL_OINTMENT | CUTANEOUS | Status: DC | PRN

## 2019-02-07 MED ORDER — HYDROMORPHONE HCL 1 MG/ML IJ SOLN
INTRAMUSCULAR | Status: AC
  Filled 2019-02-07: qty 1

## 2019-02-07 MED ORDER — ONDANSETRON HCL 4 MG/2ML IJ SOLN
INTRAMUSCULAR | Status: DC | PRN
  Administered 2019-02-07: 4 mg via INTRAVENOUS

## 2019-02-07 MED ORDER — DEXAMETHASONE SODIUM PHOSPHATE 10 MG/ML IJ SOLN
INTRAMUSCULAR | Status: AC
  Filled 2019-02-07: qty 1

## 2019-02-07 MED ORDER — OXYCODONE HCL 5 MG PO TABS
5.0000 mg | ORAL_TABLET | ORAL | Status: DC | PRN
  Administered 2019-02-07 – 2019-02-09 (×10): 10 mg via ORAL
  Administered 2019-02-10 (×2): 5 mg via ORAL
  Filled 2019-02-07 (×8): qty 2
  Filled 2019-02-07: qty 1
  Filled 2019-02-07 (×3): qty 2

## 2019-02-07 MED ORDER — LACTATED RINGERS IV SOLN
INTRAVENOUS | Status: DC
  Administered 2019-02-07 (×2): via INTRAVENOUS

## 2019-02-07 MED ORDER — LACTATED RINGERS IV SOLN: INTRAVENOUS | Status: DC

## 2019-02-07 MED ORDER — SODIUM BICARBONATE 8.4 % IV SOLN
INTRAVENOUS | Status: AC
  Filled 2019-02-07: qty 50

## 2019-02-07 MED ORDER — SOD CITRATE-CITRIC ACID 500-334 MG/5ML PO SOLN
ORAL | Status: AC
  Filled 2019-02-07: qty 15

## 2019-02-07 MED ORDER — SODIUM CHLORIDE (PF) 0.9 % IJ SOLN
INTRAMUSCULAR | Status: DC | PRN
  Administered 2019-02-07: 12 mL/h via EPIDURAL

## 2019-02-07 MED ORDER — OXYCODONE HCL 5 MG PO TABS: 5.0000 mg | ORAL_TABLET | Freq: Once | ORAL | Status: DC | PRN

## 2019-02-07 MED ORDER — BENZOCAINE-MENTHOL 20-0.5 % EX AERO: 1.0000 "application " | INHALATION_SPRAY | CUTANEOUS | Status: DC | PRN

## 2019-02-07 MED ORDER — POTASSIUM CHLORIDE CRYS ER 20 MEQ PO TBCR
40.0000 meq | EXTENDED_RELEASE_TABLET | Freq: Every day | ORAL | Status: DC
  Administered 2019-02-08 – 2019-02-09 (×2): 40 meq via ORAL
  Filled 2019-02-07 (×3): qty 2

## 2019-02-07 MED ORDER — MIDAZOLAM HCL 2 MG/2ML IJ SOLN
INTRAMUSCULAR | Status: DC | PRN
  Administered 2019-02-07: 2 mg via INTRAVENOUS

## 2019-02-07 MED ORDER — WITCH HAZEL-GLYCERIN EX PADS: 1.0000 "application " | MEDICATED_PAD | CUTANEOUS | Status: DC | PRN

## 2019-02-07 MED ORDER — FENTANYL CITRATE (PF) 100 MCG/2ML IJ SOLN
INTRAMUSCULAR | Status: DC | PRN
  Administered 2019-02-07: 100 ug via INTRAVENOUS

## 2019-02-07 SURGICAL SUPPLY — 23 items
BLADE SURG 11 STRL SS (BLADE) ×3 IMPLANT
DRSG OPSITE POSTOP 3X4 (GAUZE/BANDAGES/DRESSINGS) ×3 IMPLANT
DRSG OPSITE POSTOP 4X10 (GAUZE/BANDAGES/DRESSINGS) ×2 IMPLANT
DURAPREP 26ML APPLICATOR (WOUND CARE) ×3 IMPLANT
GLOVE BIOGEL PI IND STRL 7.0 (GLOVE) ×1 IMPLANT
GLOVE BIOGEL PI IND STRL 7.5 (GLOVE) ×1 IMPLANT
GLOVE BIOGEL PI INDICATOR 7.0 (GLOVE) ×2
GLOVE BIOGEL PI INDICATOR 7.5 (GLOVE) ×2
GLOVE ECLIPSE 7.5 STRL STRAW (GLOVE) ×3 IMPLANT
GOWN STRL REUS W/TWL LRG LVL3 (GOWN DISPOSABLE) ×6 IMPLANT
HIBICLENS CHG 4% 4OZ BTL (MISCELLANEOUS) ×3 IMPLANT
NEEDLE HYPO 22GX1.5 SAFETY (NEEDLE) ×3 IMPLANT
NS IRRIG 1000ML POUR BTL (IV SOLUTION) ×3 IMPLANT
PACK ABDOMINAL MINOR (CUSTOM PROCEDURE TRAY) ×3 IMPLANT
PROTECTOR NERVE ULNAR (MISCELLANEOUS) ×3 IMPLANT
SPONGE LAP 4X18 RFD (DISPOSABLE) IMPLANT
SUT VIC AB 2-0 CT1 27 (SUTURE) ×12
SUT VIC AB 2-0 CT1 TAPERPNT 27 (SUTURE) IMPLANT
SUT VICRYL 0 UR6 27IN ABS (SUTURE) ×3 IMPLANT
SUT VICRYL 4-0 PS2 18IN ABS (SUTURE) ×3 IMPLANT
SYR CONTROL 10ML LL (SYRINGE) ×3 IMPLANT
TOWEL OR 17X24 6PK STRL BLUE (TOWEL DISPOSABLE) ×6 IMPLANT
TRAY FOLEY W/BAG SLVR 14FR (SET/KITS/TRAYS/PACK) ×3 IMPLANT

## 2019-02-07 NOTE — Anesthesia Procedure Notes (Signed)
Epidural Patient location during procedure: OB Start time: 02/07/2019 1:36 AM End time: 02/07/2019 1:46 AM  Staffing Anesthesiologist: Pervis Hocking, DO Performed: anesthesiologist   Preanesthetic Checklist Completed: patient identified, pre-op evaluation, timeout performed, IV checked, risks and benefits discussed and monitors and equipment checked  Epidural Patient position: sitting Prep: site prepped and draped and DuraPrep Patient monitoring: continuous pulse ox, blood pressure, heart rate and cardiac monitor Approach: midline Location: L3-L4 Injection technique: LOR air  Needle:  Needle type: Tuohy  Needle gauge: 17 G Needle length: 9 cm Needle insertion depth: 6 cm Catheter type: closed end flexible Catheter size: 19 Gauge Catheter at skin depth: 11 cm Test dose: negative  Assessment Sensory level: T8 Events: blood not aspirated, injection not painful, no injection resistance, negative IV test and no paresthesia  Additional Notes Patient identified. Risks/Benefits/Options discussed with patient including but not limited to bleeding, infection, nerve damage, paralysis, failed block, incomplete pain control, headache, blood pressure changes, nausea, vomiting, reactions to medication both or allergic, itching and postpartum back pain. Confirmed with bedside nurse the patient's most recent platelet count. Confirmed with patient that they are not currently taking any anticoagulation, have any bleeding history or any family history of bleeding disorders. Patient expressed understanding and wished to proceed. All questions were answered. Sterile technique was used throughout the entire procedure. Please see nursing notes for vital signs. Test dose was given through epidural catheter and negative prior to continuing to dose epidural or start infusion. Warning signs of high block given to the patient including shortness of breath, tingling/numbness in hands, complete motor  block, or any concerning symptoms with instructions to call for help. Patient was given instructions on fall risk and not to get out of bed. All questions and concerns addressed with instructions to call with any issues or inadequate analgesia.  Reason for block:procedure for pain

## 2019-02-07 NOTE — Anesthesia Preprocedure Evaluation (Signed)
Anesthesia Evaluation  Patient identified by MRN, date of birth, ID band Patient awake    Reviewed: Allergy & Precautions, Patient's Chart, lab work & pertinent test results  Airway Mallampati: I  TM Distance: >3 FB     Dental  (+) Teeth Intact, Dental Advisory Given   Pulmonary Current Smoker,    breath sounds clear to auscultation       Cardiovascular hypertension, Pt. on medications  Rhythm:Regular Rate:Normal     Neuro/Psych  Headaches, negative psych ROS   GI/Hepatic GERD  ,(+)     substance abuse  marijuana use,   Endo/Other  negative endocrine ROS  Renal/GU negative Renal ROS  negative genitourinary   Musculoskeletal negative musculoskeletal ROS (+)   Abdominal   Peds  Hematology  (+) anemia , hct 34.9, plt 241   Anesthesia Other Findings   Reproductive/Obstetrics (+) Pregnancy G6P3  preE with SF                             Anesthesia Physical  Anesthesia Plan  ASA: III and emergent  Anesthesia Plan: Epidural   Post-op Pain Management:    Induction:   PONV Risk Score and Plan:   Airway Management Planned: Natural Airway  Additional Equipment: None  Intra-op Plan:   Post-operative Plan:   Informed Consent: I have reviewed the patients History and Physical, chart, labs and discussed the procedure including the risks, benefits and alternatives for the proposed anesthesia with the patient or authorized representative who has indicated his/her understanding and acceptance.       Plan Discussed with:   Anesthesia Plan Comments:         Anesthesia Quick Evaluation

## 2019-02-07 NOTE — Anesthesia Postprocedure Evaluation (Signed)
Anesthesia Post Note  Patient: [name], chartered  Procedure(s) Performed: POST PARTUM TUBAL LIGATION (Bilateral Abdomen)     Patient location during evaluation: PACU Anesthesia Type: Epidural Level of consciousness: awake and alert and oriented Pain management: pain level controlled Vital Signs Assessment: post-procedure vital signs reviewed and stable Respiratory status: spontaneous breathing, nonlabored ventilation and respiratory function stable Cardiovascular status: blood pressure returned to baseline Postop Assessment: no apparent nausea or vomiting and epidural receding Anesthetic complications: no                 [name]

## 2019-02-07 NOTE — Progress Notes (Signed)
Racine Cavitt is a 33 y.o. [phone] at [redacted]w[redacted]d admitted for induction of labor due to Pre Eclampsia with severe symptoms(visual, headaches, severe range BPs - 180's/110's).  Subjective: Comfortable with epidural.  Objective: BP 123/67   Pulse 76   Temp 98 F (36.7 C) (Oral)   Resp 16   Ht 5\' 2"  (1.575 m)   Wt 81.6 kg   LMP 05/27/2018 (Exact Date)   SpO2 99%   BMI 32.92 kg/m  Total I/O In: 2310 [P.O.:1460; I.V.:750; IV Piggyback:100] Out: 1400 [Urine:1400]  FHT:  FHR: 120 bpm, variability: moderate,  accelerations:  Present,  decelerations:  Absent UC:  Difficult to trace  SVE:   Dilation: 5 Effacement (%): 60 Station: -2 Exam by: Marcie Shearon    Labs: Lab Results  Component Value Date   WBC 12.9 (H) 02/07/2019   HGB 12.1 02/07/2019   HCT 34.9 (L) 02/07/2019   MCV 89.9 02/07/2019   PLT 241 02/07/2019    Assessment / Plan: Induction of Labor due to preeclampsia with severe symptoms  Labor: S/p cyto x 3 and Cooks catheter. AROM with clear fluid at this exam. IUPC placed anteriorly due to difficulty monitoring ctx. Will start low dose Pit. Anticipate SVD. Fetal Wellbeing:  Category I Pain Control:  Epidural  Pre-eclampsia: Continue Mag, IV anti-hypertensive protocol (s/p labetalol and hydralazine). One severe range; given Labetalol 20 mg IV and now WNL. I/D:  GBS unknown, culture pending. PCN until culture results.  Barrington Ellison, MD [facility] Family Medicine Fellow, [facility] for [employer], [place]

## 2019-02-07 NOTE — Op Note (Signed)
Cassidy Braun  02/07/2019  PREOPERATIVE DIAGNOSIS:  Multiparity, undesired fertility  POSTOPERATIVE DIAGNOSIS:  Multiparity, undesired fertility  PROCEDURE:  Postpartum Bilateral Tubal Sterilization via bilateral partial salpingectomy  ANESTHESIA:  Epidural and local analgesia using [id number] Marcaine  COMPLICATIONS:  None immediate.  ESTIMATED BLOOD LOSS: 5 ml.  INDICATIONS: 33 y.o. [id number]  with undesired fertility,status post vaginal delivery, desires permanent sterilization.  Other reversible forms of contraception were discussed with patient; she declines all other modalities. Risks of procedure discussed with patient including but not limited to: risk of regret, permanence of method, bleeding, infection, injury to surrounding organs and need for additional procedures.  Failure risk of 0.5-1% with increased risk of ectopic gestation if pregnancy occurs was also discussed with patient.     FINDINGS:  Normal uterus, tubes, and ovaries.  PROCEDURE DETAILS: The patient was taken to the operating room where her spinal anesthesia was dosed up to surgical level and found to be adequate.  She was then placed in a supine position and prepped and draped in the usual sterile fashion.  After an adequate timeout was performed, attention was turned to the patient's abdomen where a small transverse skin incision was made under the umbilical fold. The incision was taken down to the layer of fascia using the scalpel, and fascia was incised, and extended bilaterally. The peritoneum was entered in a sharp fashion. The patient was placed in Trendelenburg.  A moist lap pad was used to move omentum and bowel away until the left fallopian tube was identified and grasped with a Babcock clamp, and followed out to the fimbriated end.  The midportion was then elevated and Kelly clamps were placed on the mesosalpinx. The fallopian tube was removed with Metzenbaum scissors and the mesosalpinx was ligated using 2-0  Vicryl. Excellent hemostasis was achieved.  A similar process was carried out on the right side allowing for bilateral tubal sterilization.  Good hemostasis was noted overall.  The instruments were then removed from the patient's abdomen and the fascial incision was repaired with 0 Vicryl, and the skin was closed with a 4-0 Vicryl subcuticular stitch. 10 cc of 0.25% Marcaine was injected into the incision site.   The patient tolerated the procedure well.  Sponge, lap, and needle counts were correct times two.  The patient was then taken to the recovery room awake, extubated and in stable condition.  Seham Gardenhire L Chadley Dziedzic DO 02/07/2019 5:06 PM

## 2019-02-07 NOTE — Progress Notes (Signed)
Patient ID: Penda Coblentz, female   DOB: [birth year], 33 y.o.   MRN: [id number]  Risks of procedure discussed with patient including but not limited to: risk of regret, permanence of method, bleeding, infection, injury to surrounding organs and need for additional procedures.  Failure risk of 1 -2 % with increased risk of ectopic gestation if pregnancy occurs was also discussed with patient.    Truett Mainland, DO 02/07/2019 3:56 PM

## 2019-02-07 NOTE — Discharge Summary (Signed)
Postpartum Discharge Summary     Patient Name: Cassidy Braun DOB: [birth year] MRN: [id number]  Date of admission: 02/06/2019 Delivering Provider: Chauncey Mann   Date of discharge: 02/10/2019  Admitting diagnosis: Induction Intrauterine pregnancy: [redacted]w[redacted]d    Secondary diagnosis:  Principal Problem:   Severe preeclampsia, delivered Active Problems:   Supervision of high-risk pregnancy   Unwanted fertility   S/P tubal ligation   Postpartum care following vaginal delivery  Additional problems: None     Discharge diagnosis: Preterm Pregnancy Delivered and Preeclampsia (severe)                    Post partum procedures: Postpartum tubal ligation  Augmentation: AROM, Pitocin, Cytotec and Cooks catheter  Complications: None  Hospital course:  Onset of Labor With Vaginal Delivery     33y.o. yo [id number] admitted on 02/06/2019. Patient had an uncomplicated labor course as follows: Patient presented to L&D for IOL for severe Pre-E. Initial SVE: 0.5/thick/-3.  Patient received Cytotec, Cooks catheter, AROM and Pitocin. She received Epidural. She then progressed to complete with uncomplicated delivery. Infant to NICU due to weight.  Membrane Rupture Time/Date: 3:19 AM ,02/07/2019   Intrapartum Procedures: Episiotomy: None [1]                                         Lacerations:  None [1]  Patient had a delivery of a Viable infant. 02/07/2019  Information for the patient's newborn:  ChDiella, Gillinghamirl DaSoo0[[id number]]Delivery Method: Vaginal, Spontaneous(Filed from Delivery Summary)   Magnesium continued for 24 hours postpartum. Patient underwent BTL on the day of delivery. BP's monitored post-partum and were elevated, had to be managed on Enalapril which was increased to 40 mg daily by time of discharge. She is ambulating, tolerating a regular diet, passing flatus, and urinating well. Patient is discharged home in stable condition on 02/10/19.  Delivery time: 4:47 AM    Magnesium Sulfate received: Yes BMZ received: Yesx1 Rhophylac:No MMR:No Transfusion:No  Physical exam  Vitals:   02/10/19 0606 02/10/19 0730 02/10/19 0801 02/10/19 1101  BP: 117/60 (!) 153/96 (!) 159/92 120/75  Pulse: 83 77 75 86  Resp: '18 18  17  ' Temp: 98.6 F (37 C) 98.5 F (36.9 C)  98.5 F (36.9 C)  TempSrc: Oral Oral  Oral  SpO2: 100% 100%  99%  Weight:      Height:      General: alert, cooperative and no distress Lochia: appropriate Uterine Fundus: firm Incision: Dressing is clean, dry, and intact DVT Evaluation: No evidence of DVT seen on physical exam. Negative Homan's sign. No cords or calf tenderness.  Labs: Lab Results  Component Value Date   WBC 12.9 (H) 02/07/2019   HGB 12.1 02/07/2019   HCT 34.9 (L) 02/07/2019   MCV 89.9 02/07/2019   PLT 241 02/07/2019   CMP Latest Ref Rng & Units 02/06/2019  Glucose 70 - 99 mg/dL 75  BUN 6 - 20 mg/dL 5(L)  Creatinine 0.44 - 1.00 mg/dL 0.51  Sodium 135 - 145 mmol/L 136  Potassium 3.5 - 5.1 mmol/L 3.5  Chloride 98 - 111 mmol/L 102  CO2 22 - 32 mmol/L 21(L)  Calcium 8.9 - 10.3 mg/dL 8.7(L)  Total Protein 6.5 - 8.1 g/dL 6.9  Total Bilirubin 0.3 - 1.2 mg/dL 0.5  Alkaline Phos 38 - 126 U/L 182(H)  AST  15 - 41 U/L 19  ALT 0 - 44 U/L 17    Discharge instruction: per After Visit Summary and "Baby and Me Booklet".  After visit meds:  Allergies as of 02/10/2019   No Known Allergies     Medication List    STOP taking these medications   ALKA-SELTZER ANTACID PO   butalbital-acetaminophen-caffeine 50-325-40 MG tablet Commonly known as: FIORICET   glycopyrrolate 2 MG tablet Commonly known as: ROBINUL   ondansetron 4 MG disintegrating tablet Commonly known as: Zofran ODT   Tylenol 325 MG tablet Generic drug: acetaminophen     TAKE these medications   Blood Pressure Monitor Misc For regular home bp monitoring during pregnancy   enalapril 20 MG tablet Commonly known as: VASOTEC Take 2 tablets (40 mg  total) by mouth daily.   ibuprofen 600 MG tablet Commonly known as: ADVIL Take 1 tablet (600 mg total) by mouth every 6 (six) hours as needed for headache, mild pain, moderate pain or cramping.   oxyCODONE 5 MG immediate release tablet Commonly known as: Oxy IR/ROXICODONE Take 1 tablet (5 mg total) by mouth every 4 (four) hours as needed for severe pain or breakthrough pain.   senna-docusate 8.6-50 MG tablet Commonly known as: Senokot-S Take 2 tablets by mouth 2 (two) times daily as needed for mild constipation or moderate constipation.            Discharge Care Instructions  (From admission, onward)         Start     Ordered   02/09/19 0000  Discharge wound care:    Comments: As per discharge handout and nursing instructions   02/09/19 1335          Diet: routine diet  Activity: Advance as tolerated. Pelvic rest for 6 weeks.   Future Appointments  Date Time Provider [place]  02/14/2019 10:30 AM [facility] NURSE [facility]  03/14/2019  1:30 PM Cresenzo-Dishmon, Joaquim Lai, CNM [facility]    Newborn Data: Live born female  Birth Weight: 4 lb 3.7 oz (1920 g) APGAR: 8/9  Newborn Delivery   Birth date/time: 02/07/2019 04:47:00 Delivery type: Vaginal, Spontaneous      Baby Feeding: Bottle Disposition:NICU   02/10/2019 Verita Schneiders, MD

## 2019-02-07 NOTE — Transfer of Care (Signed)
Immediate Anesthesia Transfer of Care Note  Patient: Cassidy Braun  Procedure(s) Performed: POST PARTUM TUBAL LIGATION (Bilateral Abdomen)  Patient Location: PACU  Anesthesia Type:Epidural  Level of Consciousness: awake, alert  and oriented  Airway & Oxygen Therapy: Patient Spontanous Breathing  Post-op Assessment: Report given to RN and Post -op Vital signs reviewed and stable  Post vital signs: Reviewed and stable  Last Vitals:  Vitals Value Taken Time  BP    Temp    Pulse    Resp    SpO2      Last Pain:  Vitals:   02/07/19 1450  TempSrc:   PainSc: 0-No pain      Patients Stated Pain Goal: 3 ([id number])  Complications: No apparent anesthesia complications

## 2019-02-07 NOTE — Anesthesia Preprocedure Evaluation (Signed)
Anesthesia Evaluation  Patient identified by MRN, date of birth, ID band Patient awake    Reviewed: Allergy & Precautions, Patient's Chart, lab work & pertinent test results  Airway Mallampati: I  TM Distance: >3 FB     Dental  (+) Teeth Intact, Dental Advisory Given   Pulmonary Current Smoker,    breath sounds clear to auscultation       Cardiovascular hypertension, Pt. on medications  Rhythm:Regular Rate:Normal     Neuro/Psych  Headaches, negative psych ROS   GI/Hepatic GERD  ,(+)     substance abuse  marijuana use,   Endo/Other  negative endocrine ROS  Renal/GU negative Renal ROS  negative genitourinary   Musculoskeletal negative musculoskeletal ROS (+)   Abdominal   Peds  Hematology  (+) anemia , hct 34.9, plt 241   Anesthesia Other Findings   Reproductive/Obstetrics G6P3  preE with SF  Desires sterilization post partum                             Anesthesia Physical  Anesthesia Plan  ASA: III and emergent  Anesthesia Plan: Epidural   Post-op Pain Management:    Induction:   PONV Risk Score and Plan: 3 and Ondansetron, Treatment may vary due to age or medical condition and Propofol infusion  Airway Management Planned: Natural Airway and Nasal Cannula  Additional Equipment: None  Intra-op Plan:   Post-operative Plan:   Informed Consent: I have reviewed the patients History and Physical, chart, labs and discussed the procedure including the risks, benefits and alternatives for the proposed anesthesia with the patient or authorized representative who has indicated his/her understanding and acceptance.     Dental advisory given  Plan Discussed with: CRNA and Surgeon  Anesthesia Plan Comments: (Will use existing epidural for PPTL. Bryon Lions, MD)        Anesthesia Quick Evaluation

## 2019-02-07 NOTE — Progress Notes (Signed)
IV fluid bolus started.

## 2019-02-08 ENCOUNTER — Encounter (HOSPITAL_COMMUNITY): Payer: Self-pay | Admitting: Family Medicine

## 2019-02-08 LAB — CULTURE, BETA STREP (GROUP B ONLY)

## 2019-02-08 MED ORDER — ENALAPRIL MALEATE 5 MG PO TABS
20.0000 mg | ORAL_TABLET | Freq: Every day | ORAL | Status: DC
  Administered 2019-02-09: 20 mg via ORAL
  Filled 2019-02-08: qty 4

## 2019-02-08 MED ORDER — ENALAPRIL MALEATE 5 MG PO TABS
10.0000 mg | ORAL_TABLET | Freq: Once | ORAL | Status: AC
  Administered 2019-02-08: 10 mg via ORAL
  Filled 2019-02-08 (×2): qty 2

## 2019-02-08 NOTE — Anesthesia Postprocedure Evaluation (Signed)
Anesthesia Post Note  Patient: [name], chartered  Procedure(s) Performed: AN AD [facility]     Patient location during evaluation: OB High Risk Anesthesia Type: Epidural Level of consciousness: awake, awake and alert and oriented Pain management: pain level controlled Vital Signs Assessment: post-procedure vital signs reviewed and stable Respiratory status: spontaneous breathing and respiratory function stable Cardiovascular status: blood pressure returned to baseline Postop Assessment: no headache, epidural receding, patient able to bend at knees, adequate PO intake, no backache, no apparent nausea or vomiting and able to ambulate Anesthetic complications: no    Last Vitals:  Vitals:   02/08/19 0422 02/08/19 0732  BP: 135/75 (!) 154/84  Pulse: 78 66  Resp: 19 18  Temp: 36.4 C 36.9 C  SpO2: 100% 100%    Last Pain:  Vitals:   02/08/19 0732  TempSrc: Oral  PainSc:    Pain Goal: Patients Stated Pain Goal: 3 (02/08/19 0433)                 Bufford Spikes

## 2019-02-08 NOTE — Progress Notes (Signed)
Post Partum Day 1 s/p VD at [redacted]w[redacted]d for severe PEC, also POD#1 Bilateral Salpingectomy for sterilization.  Subjective: Voiding, tolerating PO and + flatus. Some incisional pain, Baby doing well in NICU.  Patient denies any headaches, visual symptoms, RUQ/epigastric pain or other concerning symptoms.  Objective: Blood pressure (!) 154/84, pulse 66, temperature 98.4 F (36.9 C), temperature source Oral, resp. rate 18, height 5\' 2"  (1.575 m), weight 81.6 kg, last menstrual period 05/27/2018, SpO2 100 %, unknown if currently breastfeeding.  Physical Exam:  General: alert and no distress Lochia: appropriate Uterine Fundus: firm, NT Incision: healing well, mild TTP, dressing in place with mild drainage DVT Evaluation: No evidence of DVT seen on physical exam. Negative Homan's sign. No cords or calf tenderness.  Recent Labs    02/06/19 1244 02/07/19 0053  HGB 12.0 12.1  HCT 35.5* 34.9*    Assessment/Plan: Enalapril 20 mg daily for BP, increased from yesterday Bottle feeding Plan for discharge tomorrow if stable   LOS: 2 days   Verita Schneiders, MD 02/08/2019, 11:05 AM

## 2019-02-08 NOTE — Plan of Care (Signed)
  Problem: Clinical Measurements: Goal: Ability to maintain clinical measurements within normal limits will improve Outcome: Progressing Goal: Will remain free from infection Outcome: Progressing   Problem: Activity: Goal: Risk for activity intolerance will decrease Outcome: Progressing   Problem: Nutrition: Goal: Adequate nutrition will be maintained Outcome: Completed/Met   Problem: Coping: Goal: Level of anxiety will decrease Outcome: Progressing   Problem: Elimination: Goal: Will not experience complications related to bowel motility Outcome: Progressing

## 2019-02-09 LAB — SURGICAL PATHOLOGY

## 2019-02-09 MED ORDER — ENALAPRIL MALEATE 20 MG PO TABS: 20.0000 mg | ORAL_TABLET | Freq: Every day | ORAL | 2 refills | Status: DC

## 2019-02-09 MED ORDER — NIFEDIPINE 10 MG PO CAPS: 20.0000 mg | ORAL_CAPSULE | ORAL | Status: DC | PRN

## 2019-02-09 MED ORDER — SENNOSIDES-DOCUSATE SODIUM 8.6-50 MG PO TABS: 2.0000 | ORAL_TABLET | Freq: Two times a day (BID) | ORAL | 2 refills | Status: DC | PRN

## 2019-02-09 MED ORDER — ENALAPRIL MALEATE 20 MG PO TABS: 40.0000 mg | ORAL_TABLET | Freq: Every day | ORAL | 2 refills | Status: DC

## 2019-02-09 MED ORDER — ENALAPRIL MALEATE 5 MG PO TABS
40.0000 mg | ORAL_TABLET | Freq: Every day | ORAL | Status: DC
  Administered 2019-02-10: 40 mg via ORAL
  Filled 2019-02-09: qty 8

## 2019-02-09 MED ORDER — ENALAPRIL MALEATE 5 MG PO TABS
20.0000 mg | ORAL_TABLET | Freq: Once | ORAL | Status: AC
  Administered 2019-02-09: 20 mg via ORAL
  Filled 2019-02-09: qty 4

## 2019-02-09 MED ORDER — ENALAPRIL MALEATE 5 MG PO TABS: 40.0000 mg | ORAL_TABLET | Freq: Every day | ORAL | Status: DC

## 2019-02-09 MED ORDER — OXYCODONE HCL 5 MG PO TABS: 5.0000 mg | ORAL_TABLET | ORAL | 0 refills | Status: DC | PRN

## 2019-02-09 MED ORDER — IBUPROFEN 600 MG PO TABS: 600.0000 mg | ORAL_TABLET | Freq: Four times a day (QID) | ORAL | 2 refills | Status: DC | PRN

## 2019-02-09 MED ORDER — ENALAPRIL MALEATE 5 MG PO TABS: 20.0000 mg | ORAL_TABLET | Freq: Once | ORAL | Status: DC

## 2019-02-09 MED ORDER — NIFEDIPINE 10 MG PO CAPS
10.0000 mg | ORAL_CAPSULE | ORAL | Status: DC | PRN
  Administered 2019-02-09: 10 mg via ORAL
  Filled 2019-02-09: qty 1

## 2019-02-09 MED ORDER — LABETALOL HCL 5 MG/ML IV SOLN: 40.0000 mg | INTRAVENOUS | Status: DC | PRN

## 2019-02-09 MED FILL — IBUPROFEN 600 MG TABLET: 600 | 7 days supply | Qty: 30 | Fill #0

## 2019-02-09 MED FILL — SENNA S 8.6-50 MG TABS: 8.6-50 | 7 days supply | Qty: 30 | Fill #0

## 2019-02-09 MED FILL — ENALAPRIL MALEATE 20 MG TAB: 20 | 30 days supply | Qty: 60 | Fill #0

## 2019-02-09 MED FILL — oxyCODONE HCL 5 MG TABS: 5 | 4 days supply | Qty: 20 | Fill #0

## 2019-02-09 NOTE — Progress Notes (Signed)
Faculty Practice OB/GYN Attending Note  Patient had another occurrence of severe range BP Patient Vitals for the past 24 hrs:  BP Temp Temp src Pulse Resp SpO2  02/09/19 1409 (!) 175/78 - - 77 - -  02/09/19 1408 (!) 166/94 98.8 F (37.1 C) Oral 76 - 100 %  02/09/19 1150 130/71 98.7 F (37.1 C) Oral 67 18 100 %  02/09/19 1002 130/72 - - 64 - 100 %  02/09/19 0835 (!) 170/92 - - 66 - -  02/09/19 0819 (!) 158/102 98.2 F (36.8 C) Oral 62 18 100 %  02/09/19 0802 (!) 161/79 - - 66 - -  02/09/19 0800 (!) 174/90 - - 76 - -  02/09/19 0759 - 99.2 F (37.3 C) Oral - - -  02/09/19 0440 127/62 98.8 F (37.1 C) Oral 64 18 99 %  02/08/19 2314 (!) 145/80 97.8 F (36.6 C) Oral 85 18 100 %  02/08/19 2034 130/65 98 F (36.7 C) - 67 18 100 %  02/08/19 1633 134/68 98.3 F (36.8 C) Oral 76 18 99 %   Procardia 10 mg po x 1 given as part of the hypertensive protocol.  Will receive Enalapril 20 mg later, and increase daily dosage tomorrow to 40 mg.  No symptoms reported by patient.  Will continue close close observation.  Likely discharge to home tomorrow if BP is able to better controlled; plan discussed with patient.   Verita Schneiders, MD, [place] for [employer], [place]

## 2019-02-09 NOTE — Clinical Social Work Maternal (Signed)
CLINICAL SOCIAL WORK MATERNAL/CHILD NOTE  Patient Details  Name: Cassidy Braun MRN: [id number] Date of Birth: 02/14/1986  Date:  02/09/2019  Clinical Social Worker Initiating Note:  Analya Louissaint, LCSW     Date/Time: Initiated:  02/09/19/1046             Child's Name:  Chrizma Garrison   Biological Parents:  Mother, Father(Father: Charlie Garrison)   Need for Interpreter:  None   Reason for Referral:  Current Substance Use/Substance Use During Pregnancy , Other (Comment)(NICU Admission)   Address:  [address] [place] [address]    Phone number:  [phone] (home)     Additional phone number:   Household Members/Support Persons (HM/SP):   Household Member/Support Person 1, Household Member/Support Person 2   HM/SP Name Relationship DOB or Age  HM/SP -1 Destiny Robinson daughter 05/24/02  HM/SP -2 Shanya Kelch daughter 05/14/09  HM/SP -3     HM/SP -4     HM/SP -5     HM/SP -6     HM/SP -7     HM/SP -8       Natural Supports (not living in the home): Immediate Family, Extended Family   Professional Supports:None   Employment:Unemployed   Type of Work:     Education:  9 to 11 years   Homebound arranged: No  Financial Resources:Medicaid   Other Resources: WIC, Food Stamps    Cultural/Religious Considerations Which May Impact Care:   Strengths: Ability to meet basic needs , Pediatrician chosen, Home prepared for child , Understanding of illness   Psychotropic Medications:         Pediatrician:    [place]  Pediatrician List:   [place]   [place]   [place]   [place] Other([facility])  [place]   [place]     Pediatrician Fax Number:    Risk Factors/Current Problems: None   Cognitive State: Able to Concentrate , Alert , Goal Oriented , Insightful , Linear Thinking    Mood/Affect: Interested , Calm    CSW Assessment:CSW met  with MOB at bedside to discuss infant's NICU admission and current substance use. CSW introduced self and explained reason for consult. MOB was welcoming, pleasant and engaged during assessment. MOB reported that she resides with her 2 daughters and that her son (Cassidy Braun 11/05/05) resides with his father. MOB reported that she sees and talks to her son, "he just lives with his dad". MOB reported that currently her 3 older children are with her sister while she's in the hospital. MOB reported that she is unemployed and receives both WIC and food stamps. MOB reported that she has all items needed to care for infant including a car seat and a playpen with the bed attachment. CSW inquired about MOB's support system, MOB reported that her family is her support. MOB reported that she has 8 sisters.   CSW inquired about MOB's mental health history, MOB denied any mental health history. CSW inquired about history of postpartum depression. MOB endorsed a history of perinatal depression while she was pregnant with her youngest daughter in 2011. MOB reported that she had recently moved and her child's father was sentenced to go to prison during her pregnancy. MOB described her perinatal depression as crying and not caring for herself. MOB reported that she had no thoughts of harming self or anyone else during that time.  CSW acknowledged and validated MOB's feelings surrounding her experience and stressors during that   time. MOB reported that after she gave birth her symptoms subsided. MOB shared that she recently loss her mother in May 09, 2018. CSW offered condolences and inquired about how MOB was coping with her loss. MOB reported that it was unexpected and that it has been hard. MOB reported that her children are helpful and comforting when she is sad. MOB reported that she tries to just think positive thoughts and has a picture and a teddy bear that she gave to her mother for comfort. CSW positively affirmed  MOB's children being a source of comfort during this hard time. MOB reported that she usually got sad on the 17th of every month and now she has something to look forward to since it's her daughter's birth. CSW positively affirmed MOB's connection between the two dates.  CSW inquired about how MOB was feeling emotionally since giving birth, MOB reported that she feels fine. MOB presented calm and remained engaged during assessment. MOB did not demonstrate any acute mental health signs/symptoms. CSW assessed for safety, MOB denied SI, HI and domestic violence.   CSW provided education regarding the baby blues period vs. perinatal mood disorders, discussed treatment and gave resources for mental health follow up if concerns arise.  CSW recommends self-evaluation during the postpartum time period using the New Mom Checklist from Postpartum Progress and encouraged MOB to contact a medical professional if symptoms are noted at any time.    CSW provided review of Sudden Infant Death Syndrome (SIDS) precautions.  MOB verbalized understanding and reported that infant has a playpen with a bed attachment.   CSW and MOB discussed infant's NICU admission. MOB reported that it has been good and she feels well informed about infant's care. CSW informed MOB about the NICU, what to expect and resources/supports available while infant is admitted to the NICU. MOB reported that meal vouchers and a gas card would be helpful. CSW agreed to leave meal vouchers and a gas card at infant's bedside.   CSW informed MOB about the hospital drug screen policy due to documented marijuana use during pregnancy. MOB did not confirm last use of marijuana but reported that she recently was cooking and unknowingly used oil with THC in it. MOB denied any other substance use and reported that she did not plan to continue using marijuana. CSW informed MOB that infant's UDS was negative and that CDS would continue to be monitored and a CPS  report would be made if warranted. MOB asked appropriate questions regarding potential CPS report, CSW answered questions. MOB verbalized understanding and denied any CPS history.   CSW will continue to offer resources/supports while infant is admitted to the NICU.   CSW Plan/Description: Sudden Infant Death Syndrome (SIDS) Education, Perinatal Mood and Anxiety Disorder (PMADs) Education, Hospital Drug Screen Policy Information, CSW Will Continue to Monitor Umbilical Cord Tissue Drug Screen Results and Make Report if Warranted, Other Patient/Family Education    Gleb Mcguire L Lyman Balingit, LCSW 02/09/2019, 11:05 AM           

## 2019-02-09 NOTE — Progress Notes (Signed)
Post Partum Day 2 s/p VD at [redacted]w[redacted]d for severe PEC, also POD#1 Bilateral Salpingectomy for sterilization.  Subjective: Voiding, tolerating PO and + flatus. No incisional pain, Baby doing well in NICU.  Patient denies any headaches, visual symptoms, RUQ/epigastric pain or other concerning symptoms.  Objective: Blood pressure (!) 170/92, pulse 66, temperature 98.2 F (36.8 C), temperature source Oral, resp. rate 18, height 5\' 2"  (1.575 m), weight 81.6 kg, last menstrual period 05/27/2018, SpO2 100 %, unknown if currently breastfeeding. Patient Vitals for the past 24 hrs:  BP Temp Temp src Pulse Resp SpO2  02/09/19 0835 (!) 170/92 - - 66 - -  02/09/19 0819 (!) 158/102 98.2 F (36.8 C) Oral 62 18 100 %  02/09/19 0802 (!) 161/79 - - 66 - -  02/09/19 0800 (!) 174/90 - - 76 - -  02/09/19 0759 - 99.2 F (37.3 C) Oral - - -  02/09/19 0440 127/62 98.8 F (37.1 C) Oral 64 18 99 %  02/08/19 2314 (!) 145/80 97.8 F (36.6 C) Oral 85 18 100 %  02/08/19 2034 130/65 98 F (36.7 C) - 67 18 100 %  02/08/19 1633 134/68 98.3 F (36.8 C) Oral 76 18 99 %  02/08/19 1254 (!) 148/86 98.6 F (37 C) Oral 74 18 100 %   Physical Exam:  General: alert and no distress Lochia: appropriate Uterine Fundus: firm, NT Incision: healing well, mild TTP, dressing in place with mild drainage DVT Evaluation: No evidence of DVT seen on physical exam. Negative Homan's sign. No cords or calf tenderness.  Recent Labs    02/06/19 1244 02/07/19 0053  HGB 12.0 12.1  HCT 35.5* 34.9*    Assessment/Plan: Enalapril 20 mg daily for BP, has not taken this today. Will take it as prescribed, reevaluate BP.  Will monitor response for rest of day. If stable, may be discharged later. If not, may have to watch until tomorrow. Bottle feeding Plan for discharge later today or tomorrow if stable.   LOS: 3 days   Verita Schneiders, MD 02/09/2019, 8:51 AM

## 2019-02-10 DIAGNOSIS — Z9851 Tubal ligation status: Secondary | ICD-10-CM

## 2019-02-10 NOTE — Addendum Note (Signed)
Addendum  created 02/10/19 0729 by Genevie Ann, CRNA   Clinical Note Signed

## 2019-02-10 NOTE — Discharge Instructions (Signed)
Postpartum Care After Vaginal Delivery °This sheet gives you information about how to care for yourself from the time you deliver your baby to up to 6-12 weeks after delivery (postpartum period). Your health care provider may also give you more specific instructions. If you have problems or questions, contact your health care provider. °Follow these instructions at home: °Vaginal bleeding °· It is normal to have vaginal bleeding (lochia) after delivery. Wear a sanitary pad for vaginal bleeding and discharge. °? During the first week after delivery, the amount and appearance of lochia is often similar to a menstrual period. °? Over the next few weeks, it will gradually decrease to a dry, yellow-brown discharge. °? For most women, lochia stops completely by 4-6 weeks after delivery. Vaginal bleeding can vary from woman to woman. °· Change your sanitary pads frequently. Watch for any changes in your flow, such as: °? A sudden increase in volume. °? A change in color. °? Large blood clots. °· If you pass a blood clot from your vagina, save it and call your health care provider to discuss. Do not flush blood clots down the toilet before talking with your health care provider. °· Do not use tampons or douches until your health care provider says this is safe. °· If you are not breastfeeding, your period should return 6-8 weeks after delivery. If you are feeding your child breast milk only (exclusive breastfeeding), your period may not return until you stop breastfeeding. °Perineal care °· Keep the area between the vagina and the anus (perineum) clean and dry as told by your health care provider. Use medicated pads and pain-relieving sprays and creams as directed. °· If you had a cut in the perineum (episiotomy) or a tear in the vagina, check the area for signs of infection until you are healed. Check for: °? More redness, swelling, or pain. °? Fluid or blood coming from the cut or tear. °? Warmth. °? Pus or a bad  smell. °· You may be given a squirt bottle to use instead of wiping to clean the perineum area after you go to the bathroom. As you start healing, you may use the squirt bottle before wiping yourself. Make sure to wipe gently. °· To relieve pain caused by an episiotomy, a tear in the vagina, or swollen veins in the anus (hemorrhoids), try taking a warm sitz bath 2-3 times a day. A sitz bath is a warm water bath that is taken while you are sitting down. The water should only come up to your hips and should cover your buttocks. °Breast care °· Within the first few days after delivery, your breasts may feel heavy, full, and uncomfortable (breast engorgement). Milk may also leak from your breasts. Your health care provider can suggest ways to help relieve the discomfort. Breast engorgement should go away within a few days. °· If you are breastfeeding: °? Wear a bra that supports your breasts and fits you well. °? Keep your nipples clean and dry. Apply creams and ointments as told by your health care provider. °? You may need to use breast pads to absorb milk that leaks from your breasts. °? You may have uterine contractions every time you breastfeed for up to several weeks after delivery. Uterine contractions help your uterus return to its normal size. °? If you have any problems with breastfeeding, work with your health care provider or lactation consultant. °· If you are not breastfeeding: °? Avoid touching your breasts a lot. Doing this can make   your breasts produce more milk. °? Wear a good-fitting bra and use cold packs to help with swelling. °? Do not squeeze out (express) milk. This causes you to make more milk. °Intimacy and sexuality °· Ask your health care provider when you can engage in sexual activity. This may depend on: °? Your risk of infection. °? How fast you are healing. °? Your comfort and desire to engage in sexual activity. °· You are able to get pregnant after delivery, even if you have not had  your period. If desired, talk with your health care provider about methods of birth control (contraception). °Medicines °· Take over-the-counter and prescription medicines only as told by your health care provider. °· If you were prescribed an antibiotic medicine, take it as told by your health care provider. Do not stop taking the antibiotic even if you start to feel better. °Activity °· Gradually return to your normal activities as told by your health care provider. Ask your health care provider what activities are safe for you. °· Rest as much as possible. Try to rest or take a nap while your baby is sleeping. °Eating and drinking ° °· Drink enough fluid to keep your urine pale yellow. °· Eat high-fiber foods every day. These may help prevent or relieve constipation. High-fiber foods include: °? Whole grain cereals and breads. °? Brown rice. °? Beans. °? Fresh fruits and vegetables. °· Do not try to lose weight quickly by cutting back on calories. °· Take your prenatal vitamins until your postpartum checkup or until your health care provider tells you it is okay to stop. °Lifestyle °· Do not use any products that contain nicotine or tobacco, such as cigarettes and e-cigarettes. If you need help quitting, ask your health care provider. °· Do not drink alcohol, especially if you are breastfeeding. °General instructions °· Keep all follow-up visits for you and your baby as told by your health care provider. Most women visit their health care provider for a postpartum checkup within the first 3-6 weeks after delivery. °Contact a health care provider if: °· You feel unable to cope with the changes that your child brings to your life, and these feelings do not go away. °· You feel unusually sad or worried. °· Your breasts become red, painful, or hard. °· You have a fever. °· You have trouble holding urine or keeping urine from leaking. °· You have little or no interest in activities you used to enjoy. °· You have not  breastfed at all and you have not had a menstrual period for 12 weeks after delivery. °· You have stopped breastfeeding and you have not had a menstrual period for 12 weeks after you stopped breastfeeding. °· You have questions about caring for yourself or your baby. °· You pass a blood clot from your vagina. °Get help right away if: °· You have chest pain. °· You have difficulty breathing. °· You have sudden, severe leg pain. °· You have severe pain or cramping in your lower abdomen. °· You bleed from your vagina so much that you fill more than one sanitary pad in one hour. Bleeding should not be heavier than your heaviest period. °· You develop a severe headache. °· You faint. °· You have blurred vision or spots in your vision. °· You have bad-smelling vaginal discharge. °· You have thoughts about hurting yourself or your baby. °If you ever feel like you may hurt yourself or others, or have thoughts about taking your own life, get help   right away. You can go to the nearest emergency department or call:  Your local emergency services (911 in the [place]  A suicide crisis helpline, such as the [place] at 530-328-4042. This is open 24 hours a day. Summary  The period of time right after you deliver your newborn up to 6-12 weeks after delivery is called the postpartum period.  Gradually return to your normal activities as told by your health care provider.  Keep all follow-up visits for you and your baby as told by your health care provider. This information is not intended to replace advice given to you by your health care provider. Make sure you discuss any questions you have with your health care provider. Document Released: 01/04/2007 Document Revised: 03/12/2017 Document Reviewed: 12/21/2016 Elsevier Patient Education  2020 [employer].    Postpartum Hypertension Postpartum hypertension is high blood pressure that remains higher than normal after childbirth. You  may not realize that you have postpartum hypertension if your blood pressure is not being checked regularly. In most cases, postpartum hypertension will go away on its own, usually within a week of delivery. However, for some women, medical treatment is required to prevent serious complications, such as seizures or stroke. What are the causes? This condition may be caused by one or more of the following:  Hypertension that existed before pregnancy (chronic hypertension).  Hypertension that comes on as a result of pregnancy (gestational hypertension).  Hypertensive disorders during pregnancy (preeclampsia) or seizures in women who have high blood pressure during pregnancy (eclampsia).  A condition in which the liver, platelets, and red blood cells are damaged during pregnancy (HELLP syndrome).  A condition in which the thyroid produces too much hormones (hyperthyroidism).  Other rare problems of the nerves (neurological disorders) or blood disorders. In some cases, the cause may not be known. What increases the risk? The following factors may make you more likely to develop this condition:  Chronic hypertension. In some cases, this may not have been diagnosed before pregnancy.  Obesity.  Type 2 diabetes.  Kidney disease.  History of preeclampsia or eclampsia.  Other medical conditions that change the level of hormones in the body (hormonal imbalance). What are the signs or symptoms? As with all types of hypertension, postpartum hypertension may not have any symptoms. Depending on how high your blood pressure is, you may experience:  Headaches. These may be mild, moderate, or severe. They may also be steady, constant, or sudden in onset (thunderclap headache).  Changes in your ability to see (visual changes).  Dizziness.  Shortness of breath.  Swelling of your hands, feet, lower legs, or face. In some cases, you may have swelling in more than one of these locations.  Heart  palpitations or a racing heartbeat.  Difficulty breathing while lying down.  Decrease in the amount of urine that you pass. Other rare signs and symptoms may include:  Sweating more than usual. This lasts longer than a few days after delivery.  Chest pain.  Sudden dizziness when you get up from sitting or lying down.  Seizures.  Nausea or vomiting.  Abdominal pain. How is this diagnosed? This condition may be diagnosed based on the results of a physical exam, blood pressure measurements, and blood and urine tests. You may also have other tests, such as a CT scan or an MRI, to check for other problems of postpartum hypertension. How is this treated? If blood pressure is high enough to require treatment, your options may include:  Medicines to reduce blood pressure (antihypertensives). Tell your health care provider if you are breastfeeding or if you plan to breastfeed. There are many antihypertensive medicines that are safe to take while breastfeeding.  Stopping medicines that may be causing hypertension.  Treating medical conditions that are causing hypertension.  Treating the complications of hypertension, such as seizures, stroke, or kidney problems. Your health care provider will also continue to monitor your blood pressure closely until it is within a safe range for you. Follow these instructions at home:  Take over-the-counter and prescription medicines only as told by your health care provider.  Return to your normal activities as told by your health care provider. Ask your health care provider what activities are safe for you.  Do not use any products that contain nicotine or tobacco, such as cigarettes and e-cigarettes. If you need help quitting, ask your health care provider.  Keep all follow-up visits as told by your health care provider. This is important. Contact a health care provider if:  Your symptoms get worse.  You have new symptoms, such as: ? A  headache that does not get better. ? Dizziness. ? Visual changes. Get help right away if:  You suddenly develop swelling in your hands, ankles, or face.  You have sudden, rapid weight gain.  You develop difficulty breathing, chest pain, racing heartbeat, or heart palpitations.  You develop severe pain in your abdomen.  You have any symptoms of a stroke. "BE FAST" is an easy way to remember the main warning signs of a stroke: ? B - Balance. Signs are dizziness, sudden trouble walking, or loss of balance. ? E - Eyes. Signs are trouble seeing or a sudden change in vision. ? F - Face. Signs are sudden weakness or numbness of the face, or the face or eyelid drooping on one side. ? A - Arms. Signs are weakness or numbness in an arm. This happens suddenly and usually on one side of the body. ? S - Speech. Signs are sudden trouble speaking, slurred speech, or trouble understanding what people say. ? T - Time. Time to call emergency services. Write down what time symptoms started.  You have other signs of a stroke, such as: ? A sudden, severe headache with no known cause. ? Nausea or vomiting. ? Seizure. These symptoms may represent a serious problem that is an emergency. Do not wait to see if the symptoms will go away. Get medical help right away. Call your local emergency services (911 in the [place] Do not drive yourself to the hospital. Summary  Postpartum hypertension is high blood pressure that remains higher than normal after childbirth.  In most cases, postpartum hypertension will go away on its own, usually within a week of delivery.  For some women, medical treatment is required to prevent serious complications, such as seizures or stroke. This information is not intended to replace advice given to you by your health care provider. Make sure you discuss any questions you have with your health care provider. Document Released: 11/10/2013 Document Revised: 04/15/2018 Document  Reviewed: 12/28/2016 Elsevier Patient Education  2020 [place].    Postpartum Tubal Ligation, Care After This sheet gives you information about how to care for yourself after your procedure. Your health care provider may also give you more specific instructions. If you have problems or questions, contact your health care provider. What can I expect after the procedure? After the procedure, you may have:  A sore throat.  Bruising or pain  in your back.  Nausea or vomiting.  Dizziness.  Mild abdominal discomfort or pain, such as cramping, gas pain, or feeling bloated.  Soreness around the incision area.  Tiredness.  Pain in your shoulders. Follow these instructions at home: Medicines  Ask your health care provider if the medicine prescribed to you: ? Requires you to avoid driving or using heavy machinery. ? Can cause constipation. You may need to take actions to prevent or treat constipation, such as:  Drink enough fluid to keep your urine pale yellow.  Take over-the-counter or prescription medicines.  Eat foods that are high in fiber, such as beans, whole grains, and fresh fruits and vegetables.  Limit foods that are high in fat and processed sugars, such as fried or sweet foods.  Do not take aspirin because it can cause bleeding. Activity  Rest for the remainder of the day.  Return to your normal activities as told by your health care provider. Ask your health care provider what activities are safe for you.  Do not have sex, douche, or put a tampon or anything else in your vagina for 6 weeks or as long as told by your health care provider.  Do not lift anything that is heavier than your baby for 2 weeks, or the limit that you are told, until your health care provider says that it is safe. Incision care      Follow instructions from your health care provider about how to take care of your incision. Make sure you: ? Wash your hands with soap and water before  and after you change your bandage (dressing). If soap and water are not available, use hand sanitizer. ? Change your dressing as told by your health care provider. ? Leave stitches (sutures), skin glue, or adhesive strips in place. These skin closures may need to stay in place for 2 weeks or longer. If adhesive strip edges start to loosen and curl up, you may trim the loose edges. Do not remove adhesive strips completely unless your health care provider tells you to do that.  Check your incision area every day for signs of infection. Check for: ? Redness, swelling, or pain. ? Fluid or blood. ? Warmth. ? Pus or a bad smell. Other Instructions  Do not take baths, swim, or use a hot tub until your health care provider approves. Ask your health care provider if you may take showers. You may only be allowed to take sponge baths.  Keep all follow-up visits as told by your health care provider. This is important. Contact a health care provider if:  You have redness, swelling, or pain around your incision.  Your incision feels warm to the touch.  Your pain does not improve after 2-3 days.  You have a rash.  You repeatedly become dizzy or lightheaded.  Your pain medicine is not helping.  You are constipated. Get help right away if you:  Have a fever.  Faint.  Have pain in your abdomen that gets worse.  Have fluid or blood coming from your incision.  You have pus or a bad smell coming from your incision.  The edges of your incision break open after the sutures have been removed.  Have shortness of breath or trouble breathing.  Have chest pain or leg pain.  Have ongoing nausea or diarrhea. Summary  Mild abdominal discomfort is common after this procedure.  Contact your health care provider if you experience problems or have concerns.  Do not lift anything that  is heavier than your baby for 2 weeks, or the limit that you are told, until your health care provider says that it  is safe.  Keep all follow-up visits as told by your health care provider. This is important. This information is not intended to replace advice given to you by your health care provider. Make sure you discuss any questions you have with your health care provider. Document Released: 09/08/2011 Document Revised: 01/27/2018 Document Reviewed: 01/27/2018 Elsevier Patient Education  2020 [employer].

## 2019-02-10 NOTE — Anesthesia Postprocedure Evaluation (Signed)
Anesthesia Post Note  Patient: [name], chartered  Procedure(s) Performed: AN AD [facility]     Patient location during evaluation: OB High Risk Anesthesia Type: Epidural Level of consciousness: awake and alert Pain management: pain level controlled Vital Signs Assessment: post-procedure vital signs reviewed and stable Respiratory status: spontaneous breathing Cardiovascular status: blood pressure returned to baseline Postop Assessment: no headache, adequate PO intake, no backache, patient able to bend at knees, able to ambulate, epidural receding and no apparent nausea or vomiting Anesthetic complications: no    Last Vitals:  Vitals:   02/10/19 0239 02/10/19 0606  BP: 136/86 117/60  Pulse: 76 83  Resp: 18 18  Temp: 36.9 C 37 C  SpO2: 100% 100%    Last Pain:  Vitals:   02/10/19 0615  TempSrc:   PainSc: 7    Pain Goal: Patients Stated Pain Goal: 3 (02/10/19 0615)                 Ailene Ards

## 2019-02-14 ENCOUNTER — Telehealth: Payer: Medicaid Other

## 2019-03-14 ENCOUNTER — Telehealth (INDEPENDENT_AMBULATORY_CARE_PROVIDER_SITE_OTHER): Payer: Medicaid Other | Admitting: Advanced Practice Midwife

## 2019-03-14 ENCOUNTER — Encounter: Payer: Self-pay | Admitting: Advanced Practice Midwife

## 2019-03-14 ENCOUNTER — Other Ambulatory Visit: Payer: Self-pay

## 2019-03-14 DIAGNOSIS — O1414 Severe pre-eclampsia complicating childbirth: Secondary | ICD-10-CM

## 2019-03-14 DIAGNOSIS — Z1389 Encounter for screening for other disorder: Secondary | ICD-10-CM

## 2019-03-14 NOTE — Progress Notes (Signed)
TELEHEALTH VIRTUAL POSTPARTUM VISIT ENCOUNTER NOTE  I connected with@ on 03/14/19 at  1:30 PM EST by telephone at home and verified that I am speaking with the correct person using two identifiers.   I discussed the limitations, risks, security and privacy concerns of performing an evaluation and management service by telephone and the availability of in person appointments. I also discussed with the patient that there may be a patient responsible charge related to this service. The patient expressed understanding and agreed to proceed.  Appointment Date: 03/14/2019  OBGYN Clinic: [facility]  Chief Complaint:  Postpartum Visit  History of Present Illness: Cassidy Braun is a 33 y.o. African-American [id number] (No LMP recorded.), seen for the above chief complaint. Her past medical history is significant for severe preE w/IOL at  36.4 weeks. Baby spent about a week in NICU, but doing great now.  She is still taking Norvasc  She is s/p normal spontaneous vaginal delivery on 02/07/19 at 36.4 weeks; she was discharged to home on PPD#2. Pregnancy complicated by severe preE. Baby is doing well.  Complains of nothing. Feels great  Vaginal bleeding or discharge: Yes  Mode of feeding infant: Bottle Intercourse: Yes  Contraception: bilateral tubal ligation PP depression s/s: No .  Any bowel or bladder issues: No  Pap smear: no abnormalities (date: 04/07/16)  Review of Systems: Positive for nothing. Her 12 point review of systems is negative or as noted in the History of Present Illness.  Patient Active Problem List   Diagnosis Date Noted  . S/P tubal ligation 02/10/2019  . Postpartum care following vaginal delivery 02/10/2019  . Severe preeclampsia, delivered 02/06/2019  . Unwanted fertility 12/28/2018  . Request for sterilization 12/14/2018  . 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 Smoker 08/23/2018  . Hyperemesis gravidarum 08/23/2018  . Ptyalism 08/23/2018  . Marijuana abuse  08/12/2018    Medications Cassidy Braun had no medications administered during this visit. Current Outpatient Medications  Medication Sig Dispense Refill  . enalapril (VASOTEC) 20 MG tablet Take 2 tablets (40 mg total) by mouth daily. 60 tablet 2  . Blood Pressure Monitor MISC For regular home bp monitoring during pregnancy (Patient not taking: Reported on 03/14/2019) 1 each 0  . ibuprofen (ADVIL) 600 MG tablet Take 1 tablet (600 mg total) by mouth every 6 (six) hours as needed for headache, mild pain, moderate pain or cramping. (Patient not taking: Reported on 03/14/2019) 30 tablet 2  . oxyCODONE (OXY IR/ROXICODONE) 5 MG immediate release tablet Take 1 tablet (5 mg total) by mouth every 4 (four) hours as needed for severe pain or breakthrough pain. (Patient not taking: Reported on 03/14/2019) 20 tablet 0  . senna-docusate (SENOKOT-S) 8.6-50 MG tablet Take 2 tablets by mouth 2 (two) times daily as needed for mild constipation or moderate constipation. (Patient not taking: Reported on 03/14/2019) 30 tablet 2   No current facility-administered medications for this visit.    Allergies Patient has no known allergies.  Physical Exam:  General:  Alert, oriented and cooperative.   Mental Status: Normal mood and affect perceived. Normal judgment and thought content.  Rest of physical exam deferred due to type of encounter   Vitals:   03/14/19 1337  BP: 137/84  Pulse: 61    PP Depression Screening:   Edinburgh Postnatal Depression Scale - 03/14/19 1337      Edinburgh Postnatal Depression Scale:  In the Past 7 Days   I have been able to laugh and see the  funny side of things.  0    I have looked forward with enjoyment to things.  0    I have blamed myself unnecessarily when things went wrong.  0    I have been anxious or worried for no good reason.  0    I have felt scared or panicky for no good reason.  0    Things have been getting on top of me.  0    I have been so unhappy that I  have had difficulty sleeping.  0    I have felt sad or miserable.  0    I have been so unhappy that I have been crying.  0    The thought of harming myself has occurred to me.  0    Edinburgh Postnatal Depression Scale Total  0       Assessment:Patient is a 33 y.o. [id number] who is 4 weeks postpartum from a normal spontaneous vaginal delivery.  She is doing very well.   Plan: contine BP meds-recheck in 4 weeks, stop meda 3 days before that appt (video)   Postpartum checkup Preeclampsia, continued HTN, medication controlled  I discussed the assessment and treatment plan with the patient. The patient was provided an opportunity to ask questions and all were answered. The patient agreed with the plan and demonstrated an understanding of the instructions.   The patient was advised to call back or seek an in-person evaluation/go to the ED for any concerning postpartum symptoms.  I provided 10 minutes of non-face-to-face time during this encounter.   Christin Fudge, [place] for [employer], Media

## 2019-04-11 ENCOUNTER — Other Ambulatory Visit: Payer: Self-pay

## 2019-04-11 ENCOUNTER — Telehealth (INDEPENDENT_AMBULATORY_CARE_PROVIDER_SITE_OTHER): Payer: Medicaid Other | Admitting: *Deleted

## 2019-04-11 VITALS — BP 134/79 | HR 80

## 2019-04-11 DIAGNOSIS — Z0130 Encounter for examination of blood pressure without abnormal findings: Secondary | ICD-10-CM

## 2019-04-11 NOTE — Progress Notes (Signed)
   I connected with  Cassidy Braun on 04/11/19 by a video enabled telemedicine application and verified that I am speaking with the correct person using two identifiers.   I discussed the limitations of evaluation and management by telemedicine. The patient expressed understanding and agreed to proceed.   NURSE VISIT- BLOOD PRESSURE CHECK  SUBJECTIVE:  Cassidy Braun is a 34 y.o. [phone] female here for BP check. She is postpartum, delivery date 02/07/2019    HYPERTENSION ROS: 136/92 day 1 147/99 day 2 136/112 day 3 Postpartum:  . Severe headaches that don't go away with tylenol/other medicines: No  . Visual changes (seeing spots/double/blurred vision) No  . Severe pain under right breast breast or in center of upper chest No  . Severe nausea/vomiting No  . Taking medicines as instructed yes  OBJECTIVE:  BP 134/79 (BP Location: Left Arm, Patient Position: Sitting, Cuff Size: Normal)   Pulse 80   Appearance alert, well appearing, and in no distress and oriented to person, place, and time.  ASSESSMENT: Postpartum  blood pressure check  PLAN: Discussed with Dr. Elonda Husky   Recommendations: Start Vasotec 20mg  tab daily. Stop medication 3 days prior to virtual visit.  Follow-up: in 4 weeks   Alice Rieger  04/11/2019 2:16 PM

## 2019-05-09 ENCOUNTER — Other Ambulatory Visit: Payer: Self-pay

## 2019-05-09 ENCOUNTER — Telehealth (INDEPENDENT_AMBULATORY_CARE_PROVIDER_SITE_OTHER): Payer: Medicaid Other | Admitting: *Deleted

## 2019-05-09 DIAGNOSIS — Z0130 Encounter for examination of blood pressure without abnormal findings: Secondary | ICD-10-CM

## 2019-05-09 NOTE — Progress Notes (Signed)
   I connected with  Cassidy Braun on 05/09/19 by a video enabled telemedicine application and verified that I am speaking with the correct person using two identifiers.   I discussed the limitations of evaluation and management by telemedicine. The patient expressed understanding and agreed to proceed.   NURSE VISIT- BLOOD PRESSURE CHECK  SUBJECTIVE:  Cassidy Braun is a 34 y.o. [phone] female here for BP check. She is postpartum with delivery on 02/07/2019.    HYPERTENSION ROS:  Postpartum:  . Severe headaches that don't go away with tylenol/other medicines: Yes  . Visual changes (seeing spots/double/blurred vision) No  . Severe pain under right breast breast or in center of upper chest No  . Severe nausea/vomiting No  . Taking medicines as instructed yes   OBJECTIVE:  There were no vitals taken for this visit.  Appearance alert, well appearing, and in no distress.  ASSESSMENT: Postpartum  blood pressure check  PLAN: Discussed with Derrill Memo, CNM   Recommendations: restart Vasotec 20mg  daily Follow-up: as needed   Alice Rieger  05/09/2019 11:29 AM

## 2019-06-07 IMAGING — US OBSTETRIC <14 WK ULTRASOUND
1 series · 15 of 28 positions shown · non-contrast
Comparison: CT Abdomen and Pelvis 02/26/2017.

CLINICAL DATA: 32-year-old female with vomiting and pelvic pressure
in the 1st trimester of pregnancy. Estimated gestational age by LMP
10 weeks 6 days.

EXAM:
OBSTETRIC <14 WK ULTRASOUND
TECHNIQUE: Transabdominal ultrasound was performed for evaluation of the
gestation as well as the maternal uterus and adnexal regions.

[Series 1: obstetric <14 wk ultrasound · 15 of 31 slices shown]
[im 1/31]
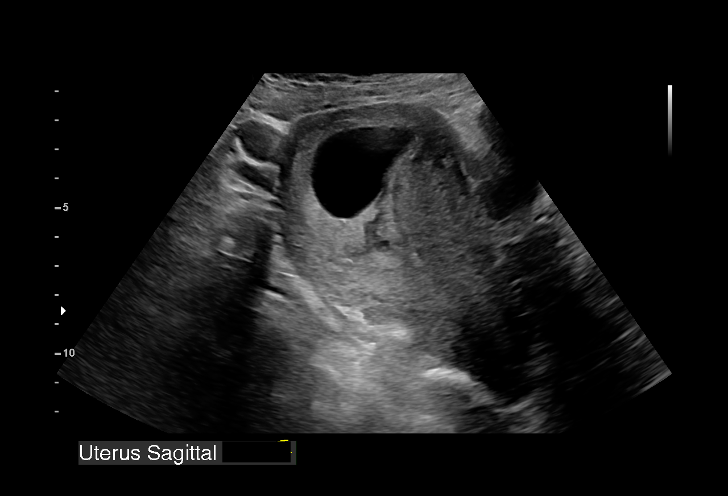
[im 3/31]
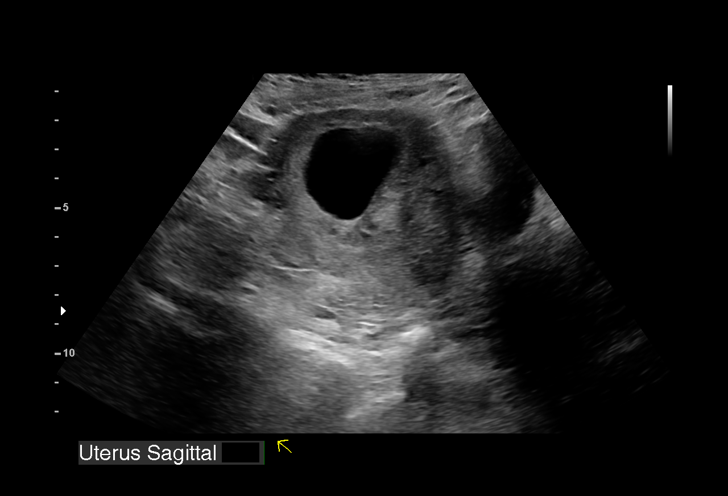
[im 5/31]
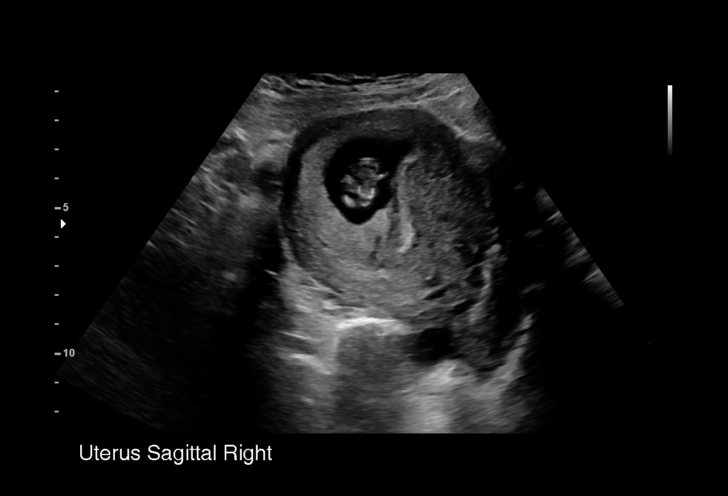
[im 7/31]
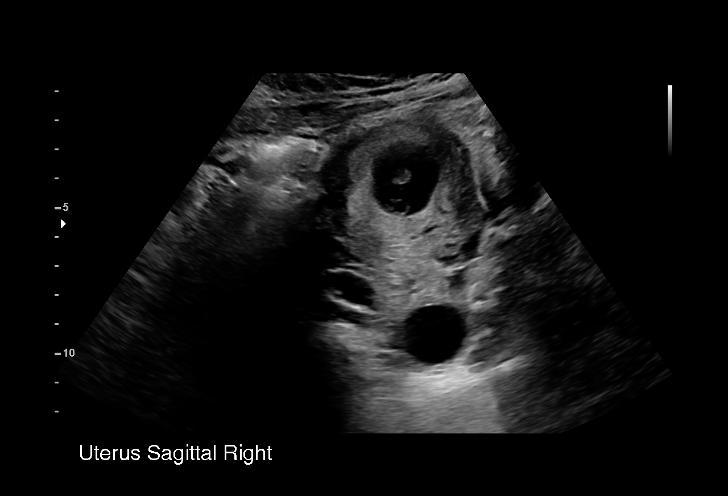
[im 9/31]
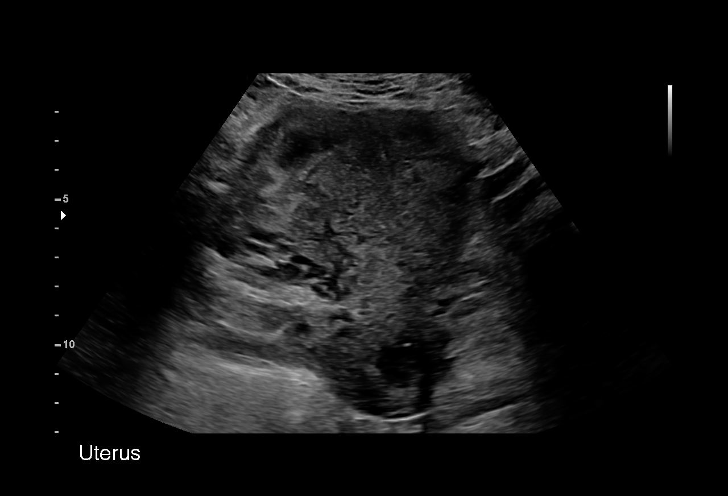
[im 12/31]
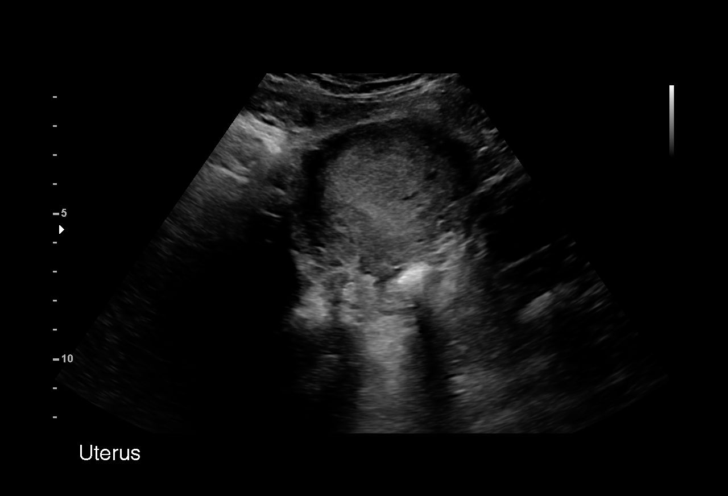
[im 14/31]
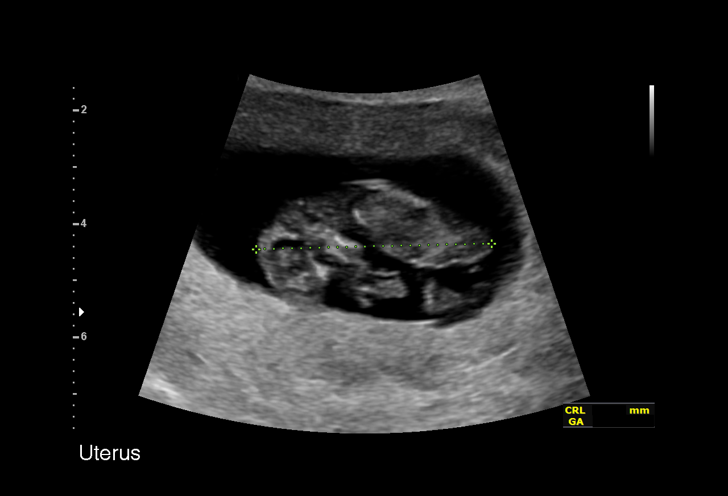
[im 16/31]
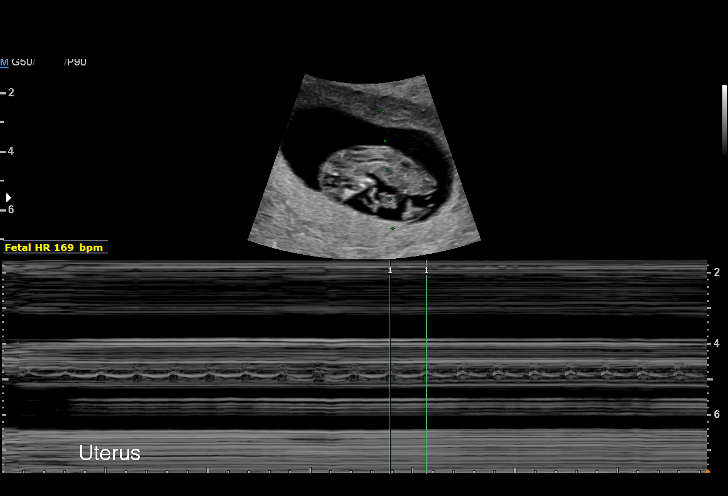
[im 17/31]
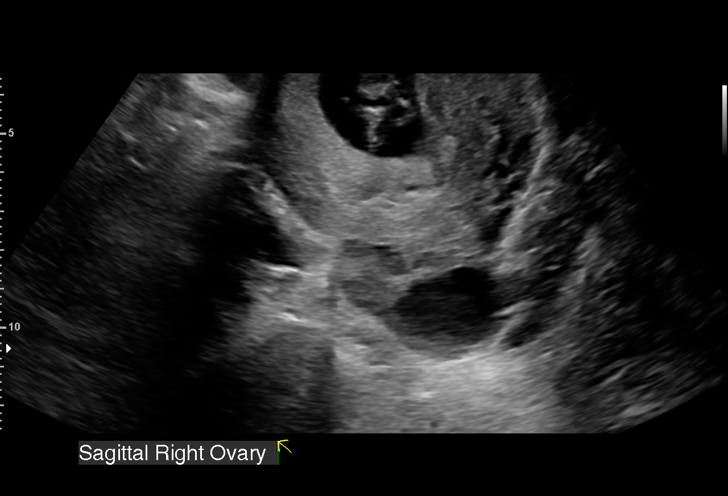
[im 19/31]
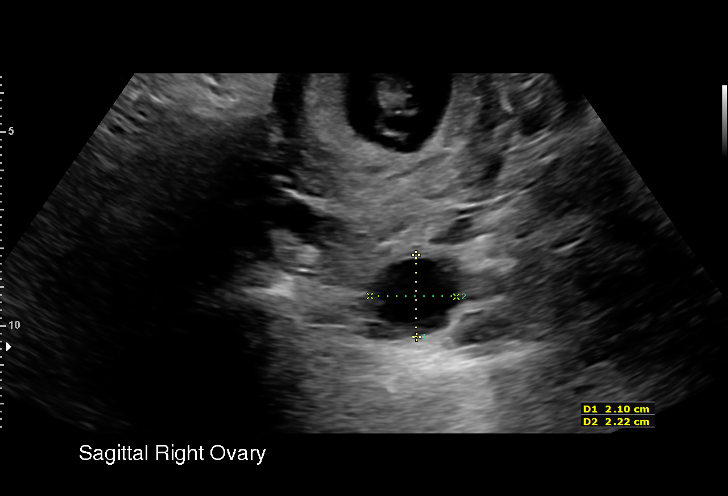
[im 22/31]
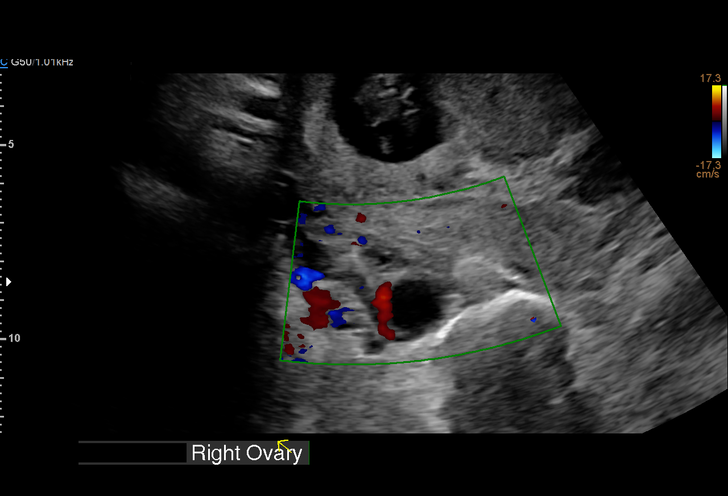
[im 24/31]
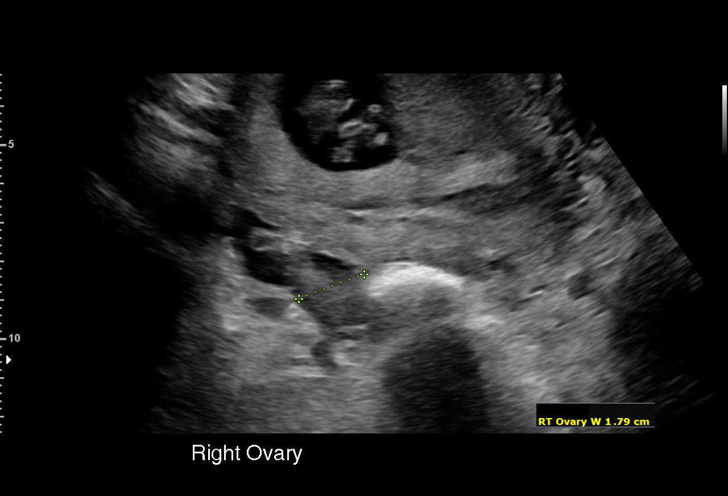
[im 26/31]
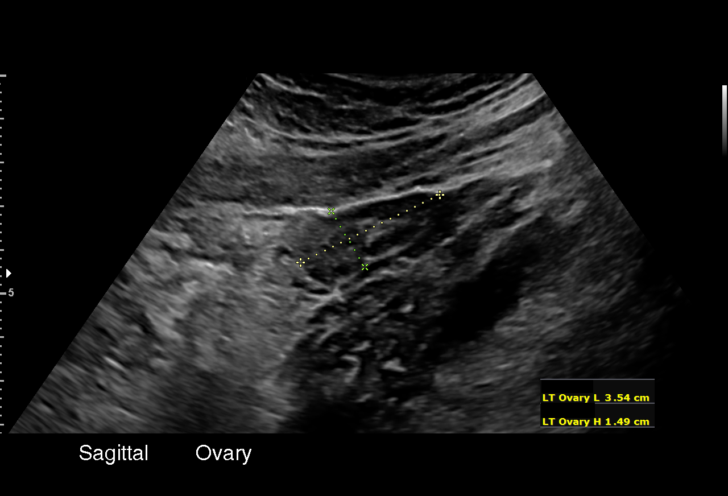
[im 28/31]
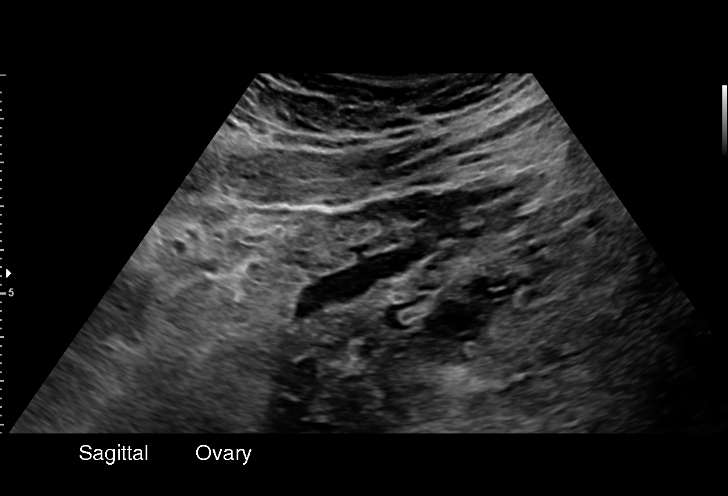
[im 31/31]
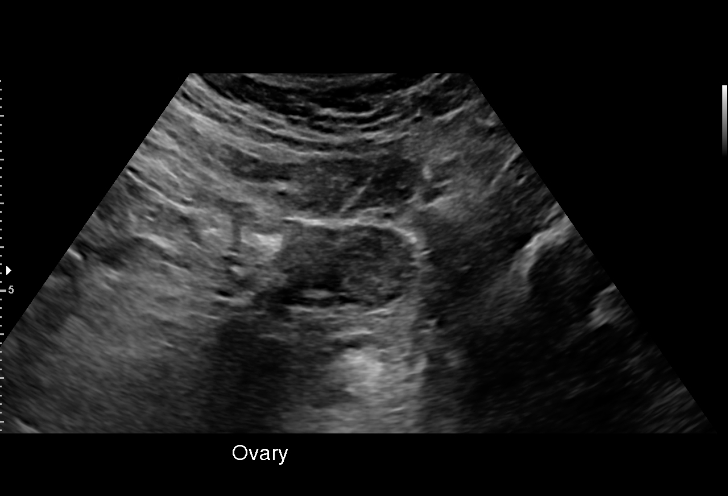

[15 of 28 positions shown; findings below may reference images not displayed]

FINDINGS: Intrauterine gestational sac: Single

Yolk sac:  Visible

Embryo:  Visible

Cardiac Activity: Detected

Heart Rate: 169 bpm

CRL:   40.5 mm   10 w 6 d                  US EDC: 03/03/2019

Subchorionic hemorrhage:  None visualized.

Maternal uterus/adnexae: The right ovary appears normal measuring
4.6 x 2.9 by 1.8 centimeters. It does contain at 2.2 centimeter
fairly simple appearing cyst which may be the corpus luteum (image
21). Negative left ovary measuring 3.5 x 1.5 by 2.5 centimeters. No
pelvic free fluid.
IMPRESSION: Single living IUP demonstrated. No acute maternal findings
visualized.

## 2019-06-29 ENCOUNTER — Ambulatory Visit: Payer: Medicaid Other | Attending: Internal Medicine

## 2019-06-29 DIAGNOSIS — Z23 Encounter for immunization: Secondary | ICD-10-CM

## 2019-06-29 NOTE — Progress Notes (Signed)
   Covid-19 Vaccination Clinic  Name:  Cassidy Braun    MRN: [id number] DOB: 1985/12/17  06/29/2019  [name] was observed post Covid-19 immunization for 15 minutes without incident. She was provided with Vaccine Information Sheet and instruction to access the V-Safe system.   [name] was instructed to call 911 with any severe reactions post vaccine: Marland Kitchen Difficulty breathing  . Swelling of face and throat  . A fast heartbeat  . A bad rash all over body  . Dizziness and weakness   Immunizations Administered    Name Date Dose VIS Date Route   Pfizer COVID-19 Vaccine 06/29/2019 11:50 AM 0.3 mL 03/03/2019 Intramuscular   Manufacturer: [place]   Lot: [id number]   [place]: [id number]

## 2019-07-27 ENCOUNTER — Ambulatory Visit: Payer: Medicaid Other | Attending: Internal Medicine

## 2019-07-27 DIAGNOSIS — Z23 Encounter for immunization: Secondary | ICD-10-CM

## 2019-07-27 NOTE — Progress Notes (Signed)
   Covid-19 Vaccination Clinic  Name:  Cassidy Braun    MRN: [id number] DOB: 27-Jan-1986  07/27/2019  Cassidy Braun was observed post Covid-19 immunization for 15 minutes without incident. She was provided with Vaccine Information Sheet and instruction to access the V-Safe system.   Cassidy Braun was instructed to call 911 with any severe reactions post vaccine: Marland Kitchen Difficulty breathing  . Swelling of face and throat  . A fast heartbeat  . A bad rash all over body  . Dizziness and weakness   Immunizations Administered    Name Date Dose VIS Date Route   Moderna COVID-19 Vaccine 07/27/2019 10:45 AM 0.5 mL 02/2019 Intramuscular   Manufacturer: Moderna   Lot: [id number]   [id number]

## 2021-12-10 DIAGNOSIS — R5383 Other fatigue: Secondary | ICD-10-CM | POA: Diagnosis not present

## 2021-12-10 DIAGNOSIS — R110 Nausea: Secondary | ICD-10-CM | POA: Diagnosis not present

## 2021-12-10 DIAGNOSIS — R52 Pain, unspecified: Secondary | ICD-10-CM | POA: Diagnosis not present

## 2021-12-10 DIAGNOSIS — B349 Viral infection, unspecified: Secondary | ICD-10-CM | POA: Diagnosis not present

## 2022-02-23 DIAGNOSIS — M79604 Pain in right leg: Secondary | ICD-10-CM | POA: Diagnosis not present

## 2022-02-23 DIAGNOSIS — R7989 Other specified abnormal findings of blood chemistry: Secondary | ICD-10-CM | POA: Diagnosis not present

## 2022-02-23 DIAGNOSIS — F17210 Nicotine dependence, cigarettes, uncomplicated: Secondary | ICD-10-CM | POA: Diagnosis not present

## 2022-02-24 DIAGNOSIS — M79604 Pain in right leg: Secondary | ICD-10-CM | POA: Diagnosis not present

## 2022-02-24 DIAGNOSIS — M79661 Pain in right lower leg: Secondary | ICD-10-CM | POA: Diagnosis not present

## 2022-05-05 DIAGNOSIS — M5432 Sciatica, left side: Secondary | ICD-10-CM | POA: Diagnosis not present

## 2022-05-13 DIAGNOSIS — M549 Dorsalgia, unspecified: Secondary | ICD-10-CM | POA: Diagnosis not present

## 2022-05-13 DIAGNOSIS — M79652 Pain in left thigh: Secondary | ICD-10-CM | POA: Diagnosis not present

## 2022-05-13 DIAGNOSIS — S76312A Strain of muscle, fascia and tendon of the posterior muscle group at thigh level, left thigh, initial encounter: Secondary | ICD-10-CM | POA: Diagnosis not present

## 2022-05-26 DIAGNOSIS — M79662 Pain in left lower leg: Secondary | ICD-10-CM | POA: Diagnosis not present

## 2022-05-26 DIAGNOSIS — S76312S Strain of muscle, fascia and tendon of the posterior muscle group at thigh level, left thigh, sequela: Secondary | ICD-10-CM | POA: Diagnosis not present

## 2022-05-26 DIAGNOSIS — F17210 Nicotine dependence, cigarettes, uncomplicated: Secondary | ICD-10-CM | POA: Diagnosis not present

## 2022-06-05 ENCOUNTER — Other Ambulatory Visit (HOSPITAL_COMMUNITY): Payer: Self-pay | Admitting: Orthopedic Surgery

## 2022-06-05 DIAGNOSIS — M5410 Radiculopathy, site unspecified: Secondary | ICD-10-CM

## 2022-06-05 DIAGNOSIS — M545 Low back pain, unspecified: Secondary | ICD-10-CM | POA: Diagnosis not present

## 2022-06-05 DIAGNOSIS — M25552 Pain in left hip: Secondary | ICD-10-CM | POA: Diagnosis not present

## 2022-06-05 DIAGNOSIS — M25562 Pain in left knee: Secondary | ICD-10-CM | POA: Diagnosis not present

## 2022-06-25 ENCOUNTER — Ambulatory Visit (HOSPITAL_COMMUNITY)
Admission: RE | Admit: 2022-06-25 | Discharge: 2022-06-25 | Disposition: A | Discharge status: Home / Self Care | Payer: Medicaid Other | Source: Ambulatory Visit | Attending: Orthopedic Surgery | Admitting: Orthopedic Surgery

## 2022-06-25 DIAGNOSIS — M5410 Radiculopathy, site unspecified: Secondary | ICD-10-CM | POA: Insufficient documentation

## 2022-06-25 DIAGNOSIS — M545 Low back pain, unspecified: Secondary | ICD-10-CM | POA: Diagnosis not present

## 2022-06-30 DIAGNOSIS — S76312D Strain of muscle, fascia and tendon of the posterior muscle group at thigh level, left thigh, subsequent encounter: Secondary | ICD-10-CM | POA: Diagnosis not present

## 2022-06-30 DIAGNOSIS — M79652 Pain in left thigh: Secondary | ICD-10-CM | POA: Diagnosis not present

## 2022-06-30 DIAGNOSIS — S8992XD Unspecified injury of left lower leg, subsequent encounter: Secondary | ICD-10-CM | POA: Diagnosis not present

## 2022-07-01 ENCOUNTER — Ambulatory Visit: Payer: Medicaid Other | Admitting: Family Medicine

## 2022-09-04 ENCOUNTER — Ambulatory Visit: Payer: Medicaid Other | Admitting: Family Medicine

## 2022-09-04 ENCOUNTER — Encounter: Payer: Self-pay | Admitting: Family Medicine

## 2022-09-04 VITALS — BP 115/76 | HR 88 | Temp 98.5°F | Ht 62.0 in | Wt 164.0 lb

## 2022-09-04 DIAGNOSIS — Z136 Encounter for screening for cardiovascular disorders: Secondary | ICD-10-CM | POA: Diagnosis not present

## 2022-09-04 DIAGNOSIS — L0292 Furuncle, unspecified: Secondary | ICD-10-CM | POA: Diagnosis not present

## 2022-09-04 DIAGNOSIS — L700 Acne vulgaris: Secondary | ICD-10-CM

## 2022-09-04 DIAGNOSIS — D171 Benign lipomatous neoplasm of skin and subcutaneous tissue of trunk: Secondary | ICD-10-CM

## 2022-09-04 DIAGNOSIS — Z8639 Personal history of other endocrine, nutritional and metabolic disease: Secondary | ICD-10-CM | POA: Diagnosis not present

## 2022-09-04 NOTE — Patient Instructions (Addendum)
CeraVe rough and bumpy wash and lotion

## 2022-09-04 NOTE — Progress Notes (Signed)
New Patient Office Visit  Subjective   Patient ID: Cassidy Braun, female    DOB: Mar 24, 1985  Age: 37 y.o. MRN: [id number]  CC:  Chief Complaint  Patient presents with   New Patient (Initial Visit)    Skin lesions   HPI Cassidy Braun presents to establish care Main concern today is to discuss skin lesions. States that they are mainly on her back and chest. States that she gets "boils" around her menstrual cycle. Describes boils in her armpits, buttocks and vagina. States that they are painful and leave scars. States that once she had an axillary one drained, but they typically do not burst. She has not tried warm compresses or expressing them. She has not been to dermatology in the past.   Outpatient Encounter Medications as of 09/04/2022  Medication Sig   tiZANidine (ZANAFLEX) 4 MG tablet Take by mouth.   [DISCONTINUED] Blood Pressure Monitor MISC For regular home bp monitoring during pregnancy   [DISCONTINUED] enalapril (VASOTEC) 20 MG tablet Take 2 tablets (40 mg total) by mouth daily.   [DISCONTINUED] ibuprofen (ADVIL) 600 MG tablet Take 1 tablet (600 mg total) by mouth every 6 (six) hours as needed for headache, mild pain, moderate pain or cramping.   No facility-administered encounter medications on file as of 09/04/2022.   Past Medical History:  Diagnosis Date   Anemia    Dysmenorrhea 07/09/2015   Frequent headaches    lipoma tumor on brain   Lipoma of head    Nausea and vomiting 07/09/2015   Pelvic pressure in female 07/09/2015   Recurrent boils    RUQ pain 07/09/2015    Past Surgical History:  Procedure Laterality Date   APPENDECTOMY     CHOLECYSTECTOMY N/A 09/02/2015   Procedure: LAPAROSCOPIC CHOLECYSTECTOMY;  Surgeon: Franky Macho, MD;  Location: [facility];  Service: General;  Laterality: N/A;   INDUCED ABORTION  2005   TUBAL LIGATION Bilateral 02/07/2019   Procedure: POST PARTUM TUBAL LIGATION;  Surgeon: Levie Heritage, DO;  Location: [facility];  Service:  Gynecology;  Laterality: Bilateral;    Family History  Problem Relation Age of Onset   Congestive Heart Failure Mother    Asthma Mother    Diabetes Mother    Hypertension Mother    COPD Mother    Cancer Mother        liver   Diabetes Father    Hypertension Father    Stroke Father    Fibroids Sister    Cirrhosis Maternal Grandmother    Other Sister        ovarian cysts    Social History   Socioeconomic History   Marital status: Single    Spouse name: Not on file   Number of children: 3   Years of education: Not on file   Highest education level: Not on file  Occupational History   Not on file  Tobacco Use   Smoking status: Every Day    Packs/day: 0.00    Years: 15.00    Additional pack years: 0.00    Total pack years: 0.00    Types: Cigarettes   Smokeless tobacco: Never   Tobacco comments:    smokes 4-5  cig daily  Vaping Use   Vaping Use: Never used  Substance and Sexual Activity   Alcohol use: Not Currently    Comment: occ   Drug use: Yes    Types: Marijuana   Sexual activity: Yes    Birth control/protection: None  Other  Topics Concern   Not on file  Social History Narrative   Not on file   Social Determinants of Health   Financial Resource Strain: Not on file  Food Insecurity: Not on file  Transportation Needs: Not on file  Physical Activity: Not on file  Stress: Not on file  Social Connections: Not on file  Intimate Partner Violence: Not on file    ROS As per HPI Objective   BP 115/76   Pulse 88   Temp 98.5 F (36.9 C)   Ht 5\' 2"  (1.575 m)   Wt 164 lb (74.4 kg)   LMP 09/04/2022 (Exact Date)   SpO2 99%   BMI 30.00 kg/m   Physical Exam Constitutional:      General: She is awake. She is not in acute distress.    Appearance: Normal appearance. She is well-developed and well-groomed. She is not ill-appearing, toxic-appearing or diaphoretic.  Cardiovascular:     Rate and Rhythm: Normal rate.     Pulses: Normal pulses.          Radial  pulses are 2+ on the right side and 2+ on the left side.       Posterior tibial pulses are 2+ on the right side and 2+ on the left side.     Heart sounds: Normal heart sounds. No murmur heard.    No gallop.  Pulmonary:     Effort: Pulmonary effort is normal. No respiratory distress.     Breath sounds: Normal breath sounds. No stridor. No wheezing, rhonchi or rales.  Musculoskeletal:     Cervical back: Full passive range of motion without pain and neck supple.     Right lower leg: No edema.     Left lower leg: No edema.  Skin:    General: Skin is warm.     Capillary Refill: Capillary refill takes less than 2 seconds.     Comments: Chaperone declined  Multiple soft, mobile nodules on back and sternum. Some are comedone with black head Right axilla with hard, mobile, tender ~2cm nodule  Multiple diffuse healed scars along buttocks and groin area  Pustular nodules in various states of healing.   Neurological:     General: No focal deficit present.     Mental Status: She is alert, oriented to person, place, and time and easily aroused. Mental status is at baseline.     GCS: GCS eye subscore is 4. GCS verbal subscore is 5. GCS motor subscore is 6.     Motor: No weakness.  Psychiatric:        Attention and Perception: Attention and perception normal.        Mood and Affect: Mood and affect normal.        Speech: Speech normal.        Behavior: Behavior normal. Behavior is cooperative.        Thought Content: Thought content normal. Thought content does not include homicidal or suicidal ideation. Thought content does not include homicidal or suicidal plan.        Cognition and Memory: Cognition and memory normal.        Judgment: Judgment normal.     Assessment & Plan:  1. Acne comedone Discussed with patient OTC wash and lotion for acne comedone. Concerned for HS. Discussed with patient that none of the skin lesions appeared to be infectious. Did not feel that oral antibiotic was  warranted at this time. Recommend patient follow up with Dermatology. Referral placed as below.  -  Ambulatory referral to Dermatology  2. Lipoma of torso See above - Ambulatory referral to Dermatology  3. Boils of multiple sites See above - Ambulatory referral to Dermatology  4. Encounter for screening for cardiovascular disorders Labs as below. Will communicate results to patient once available.  Not fasting today. Plans to return when fasting.  - Lipid panel; Future  5. Personal history of other endocrine, nutritional and metabolic disease Labs as below. Will communicate results to patient once available.  - CBC with Differential/Platelet; Future - VITAMIN D 25 Hydroxy (Vit-D Deficiency, Fractures); Future - TSH; Future - CMP14+EGFR; Future  The above assessment and management plan was discussed with the patient. The patient verbalized understanding of and has agreed to the management plan using shared-decision making. Patient is aware to call the clinic if they develop any new symptoms or if symptoms fail to improve or worsen. Patient is aware when to return to the clinic for a follow-up visit. Patient educated on when it is appropriate to go to the emergency department.   Return for CPE.   Neale Burly, DNP-FNP [facility] [address] [place], [place] [address] [phone]

## 2022-10-19 DIAGNOSIS — L732 Hidradenitis suppurativa: Secondary | ICD-10-CM | POA: Diagnosis not present

## 2022-11-05 DIAGNOSIS — R030 Elevated blood-pressure reading, without diagnosis of hypertension: Secondary | ICD-10-CM | POA: Diagnosis not present

## 2022-11-05 DIAGNOSIS — L0231 Cutaneous abscess of buttock: Secondary | ICD-10-CM | POA: Diagnosis not present

## 2023-01-04 DIAGNOSIS — G4489 Other headache syndrome: Secondary | ICD-10-CM | POA: Diagnosis not present

## 2023-01-04 DIAGNOSIS — R4781 Slurred speech: Secondary | ICD-10-CM | POA: Diagnosis not present

## 2023-01-04 DIAGNOSIS — R072 Precordial pain: Secondary | ICD-10-CM | POA: Diagnosis not present

## 2023-01-04 DIAGNOSIS — R079 Chest pain, unspecified: Secondary | ICD-10-CM | POA: Diagnosis not present

## 2023-01-04 DIAGNOSIS — F17210 Nicotine dependence, cigarettes, uncomplicated: Secondary | ICD-10-CM | POA: Diagnosis not present

## 2023-01-04 DIAGNOSIS — R531 Weakness: Secondary | ICD-10-CM | POA: Diagnosis not present

## 2023-01-04 DIAGNOSIS — R001 Bradycardia, unspecified: Secondary | ICD-10-CM | POA: Diagnosis not present

## 2023-01-04 DIAGNOSIS — Z79899 Other long term (current) drug therapy: Secondary | ICD-10-CM | POA: Diagnosis not present

## 2023-01-04 DIAGNOSIS — G459 Transient cerebral ischemic attack, unspecified: Secondary | ICD-10-CM | POA: Diagnosis not present

## 2023-01-04 DIAGNOSIS — R410 Disorientation, unspecified: Secondary | ICD-10-CM | POA: Diagnosis not present

## 2023-01-04 DIAGNOSIS — E876 Hypokalemia: Secondary | ICD-10-CM | POA: Diagnosis not present

## 2023-01-04 DIAGNOSIS — R519 Headache, unspecified: Secondary | ICD-10-CM | POA: Diagnosis not present

## 2023-01-04 DIAGNOSIS — G43909 Migraine, unspecified, not intractable, without status migrainosus: Secondary | ICD-10-CM | POA: Diagnosis not present

## 2023-01-04 DIAGNOSIS — I10 Essential (primary) hypertension: Secondary | ICD-10-CM | POA: Diagnosis not present

## 2023-01-04 DIAGNOSIS — R200 Anesthesia of skin: Secondary | ICD-10-CM | POA: Diagnosis not present

## 2023-01-04 DIAGNOSIS — R29898 Other symptoms and signs involving the musculoskeletal system: Secondary | ICD-10-CM | POA: Diagnosis not present

## 2023-01-04 DIAGNOSIS — I6782 Cerebral ischemia: Secondary | ICD-10-CM | POA: Diagnosis not present

## 2023-01-04 DIAGNOSIS — G43109 Migraine with aura, not intractable, without status migrainosus: Secondary | ICD-10-CM | POA: Diagnosis not present

## 2023-01-04 DIAGNOSIS — R112 Nausea with vomiting, unspecified: Secondary | ICD-10-CM | POA: Diagnosis not present

## 2023-01-05 DIAGNOSIS — R531 Weakness: Secondary | ICD-10-CM | POA: Diagnosis not present

## 2023-01-05 DIAGNOSIS — I071 Rheumatic tricuspid insufficiency: Secondary | ICD-10-CM | POA: Diagnosis not present

## 2023-01-05 DIAGNOSIS — R112 Nausea with vomiting, unspecified: Secondary | ICD-10-CM | POA: Diagnosis not present

## 2023-01-05 DIAGNOSIS — G43809 Other migraine, not intractable, without status migrainosus: Secondary | ICD-10-CM | POA: Diagnosis not present

## 2023-01-05 DIAGNOSIS — E876 Hypokalemia: Secondary | ICD-10-CM | POA: Diagnosis not present

## 2023-01-06 ENCOUNTER — Telehealth: Payer: Self-pay

## 2023-01-06 DIAGNOSIS — R531 Weakness: Secondary | ICD-10-CM | POA: Diagnosis not present

## 2023-01-06 NOTE — Transitions of Care (Post Inpatient/ED Visit) (Unsigned)
   01/06/2023  Name: Denica Web MRN: [id number] DOB: January 09, 1986  Today's TOC FU Call Status: Today's TOC FU Call Status:: Unsuccessful Call (1st Attempt) Unsuccessful Call (1st Attempt) Date: 01/06/23  Attempted to reach the patient regarding the most recent Inpatient/ED visit.  Follow Up Plan: Additional outreach attempts will be made to reach the patient to complete the Transitions of Care (Post Inpatient/ED visit) call.   Signature Kandis Fantasia, LPN [facility] Health Advisor [facility] You Are. We Are. [facility] Direct Dial [phone]

## 2023-01-13 ENCOUNTER — Ambulatory Visit: Payer: Medicaid Other | Admitting: Family Medicine

## 2023-01-13 ENCOUNTER — Encounter: Payer: Self-pay | Admitting: Family Medicine

## 2023-01-13 VITALS — BP 120/84 | HR 65 | Temp 98.2°F | Ht 62.0 in | Wt 169.0 lb

## 2023-01-13 DIAGNOSIS — G43009 Migraine without aura, not intractable, without status migrainosus: Secondary | ICD-10-CM

## 2023-01-13 MED ORDER — SUMATRIPTAN SUCCINATE 50 MG PO TABS: 50.0000 mg | ORAL_TABLET | ORAL | 0 refills | Status: AC

## 2023-01-13 NOTE — Patient Instructions (Signed)
[phone] [facility] Neurology

## 2023-01-13 NOTE — Progress Notes (Signed)
Subjective:  Patient ID: Cassidy Braun, female    DOB: Aug 07, 1985, 37 y.o.   MRN: [id number]  Patient Care Team: Ellamae Sia Aleen Campi, FNP as PCP - General (Family Medicine)   Chief Complaint:  Follow-up (ED follow up)  HPI: Cassidy Braun is a 37 y.o. female presenting on 01/13/2023 for Follow-up (ED follow up) Reports that on 01/04/23 she was having a headache in the morning with nausea and one episode of vomiting. Patient states that she decided to go to urgent care. Patient presented to Urgent Care for unilateral weakness on 01/04/23. There she was advised to go to the hospital due to elevated BP and symptoms of headache, nausea, left sided weakness, and numbness/tinging in her face. She decided to come home as she had her children with her. She felt worse with driving. Once home, her daughter called EMS for her and she was admitted to [facility]. 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 She improved significantly with IV Toradol, benadryl, and reglan. In addition, she received an Echocardiogram with Bubble study that was normal.  She was discharged with instructions to follow up with neurology.  Reports that since coming home she is back to baseline, has returned to work. Reports that her strength, eyesight, and balance are at baseline.  Of note, she reports that for the last 2 months she has had increased severity and frequency of migraines. She has only used OTC medications for management. She denies personal and family history of ischemic heart disease. She reports that she was told at 37 years old that she had a murmur.   Relevant past medical, surgical, family, and social history reviewed and updated as indicated.  Allergies and medications reviewed and updated. Data reviewed: Chart in Epic.   Past Medical History:  Diagnosis Date   Anemia    Dysmenorrhea 07/09/2015   Frequent headaches    lipoma tumor on brain   Lipoma of head    Nausea  and vomiting 07/09/2015   Pelvic pressure in female 07/09/2015   Recurrent boils    RUQ pain 07/09/2015    Past Surgical History:  Procedure Laterality Date   APPENDECTOMY     CHOLECYSTECTOMY N/A 09/02/2015   Procedure: LAPAROSCOPIC CHOLECYSTECTOMY;  Surgeon: Franky Macho, MD;  Location: [facility];  Service: General;  Laterality: N/A;   INDUCED ABORTION  2005   TUBAL LIGATION Bilateral 02/07/2019   Procedure: POST PARTUM TUBAL LIGATION;  Surgeon: Levie Heritage, DO;  Location: [facility];  Service: Gynecology;  Laterality: Bilateral;    Social History   Socioeconomic History   Marital status: Single    Spouse name: Not on file   Number of children: 3   Years of education: Not on file   Highest education level: Not on file  Occupational History   Not on file  Tobacco Use   Smoking status: Every Day    Current packs/day: 0.00    Types: Cigarettes   Smokeless tobacco: Never   Tobacco comments:    smokes 4-5  cig daily  Vaping Use   Vaping status: Never Used  Substance and Sexual Activity   Alcohol use: Not Currently    Comment: occ   Drug use: Yes    Types: Marijuana   Sexual activity: Yes    Birth control/protection: None  Other Topics Concern   Not on file  Social History Narrative   Not on file   Social Determinants of Health  Financial Resource Strain: Low Risk  (01/05/2023)   Received from [facility]   Overall Financial Resource Strain (CARDIA)    Difficulty of Paying Living Expenses: Not hard at all  Food Insecurity: No Food Insecurity (01/05/2023)   Received from [facility]   Hunger Vital Sign    Worried About Running Out of Food in the Last Year: Never true    Ran Out of Food in the Last Year: Never true  Transportation Needs: No Transportation Needs (01/05/2023)   Received from [facility] - Transportation    Lack of Transportation (Medical): No    Lack of Transportation (Non-Medical): No  Physical Activity: Sufficiently Active  (01/05/2023)   Received from [facility]   Exercise Vital Sign    Days of Exercise per Week: 5 days    Minutes of Exercise per Session: 30 min  Stress: Stress Concern Present (01/05/2023)   Received from [facility] - Occupational Stress Questionnaire    Feeling of Stress : Very much  Social Connections: Moderately Integrated (01/05/2023)   Received from [facility]   Social Connection and Isolation Panel [NHANES]    Frequency of Communication with Friends and Family: More than three times a week    Frequency of Social Gatherings with Friends and Family: More than three times a week    Attends Religious Services: 1 to 4 times per year    Active Member of [employer] or Organizations: No    Attends Banker Meetings: 1 to 4 times per year    Marital Status: Separated  Intimate Partner Violence: Not At Risk (01/05/2023)   Received from [facility]   Humiliation, Afraid, Rape, and Kick questionnaire    Fear of Current or Ex-Partner: No    Emotionally Abused: No    Physically Abused: No    Sexually Abused: No    Outpatient Encounter Medications as of 01/13/2023  Medication Sig   [DISCONTINUED] tiZANidine (ZANAFLEX) 4 MG tablet Take by mouth.   No facility-administered encounter medications on file as of 01/13/2023.    No Known Allergies  Review of Systems As per HPI  Objective:  BP 120/84   Pulse 65   Temp 98.2 F (36.8 C)   Ht 5\' 2"  (1.575 m)   Wt 169 lb (76.7 kg)   LMP 12/30/2022 (Approximate)   SpO2 100%   BMI 30.91 kg/m    Wt Readings from Last 3 Encounters:  01/13/23 169 lb (76.7 kg)  09/04/22 164 lb (74.4 kg)  02/06/19 180 lb (81.6 kg)   Physical Exam Constitutional:      General: She is awake. She is not in acute distress.    Appearance: Normal appearance. She is well-developed, well-groomed and overweight. She is not ill-appearing, toxic-appearing or diaphoretic.  Eyes:     Extraocular  Movements:     Right eye: Normal extraocular motion and no nystagmus.     Left eye: Normal extraocular motion and no nystagmus.     Pupils: Pupils are equal, round, and reactive to light. Pupils are equal.  Cardiovascular:     Rate and Rhythm: Normal rate and regular rhythm.     Pulses: Normal pulses.          Radial pulses are 2+ on the right side and 2+ on the left side.       Posterior tibial pulses are 2+ on the right side and 2+  on the left side.     Heart sounds: Normal heart sounds. No murmur heard.    No gallop.  Pulmonary:     Effort: Pulmonary effort is normal. No respiratory distress.     Breath sounds: Normal breath sounds. No stridor. No wheezing, rhonchi or rales.  Musculoskeletal:     Cervical back: Full passive range of motion without pain and neck supple.     Right lower leg: No edema.     Left lower leg: No edema.  Skin:    General: Skin is warm.     Capillary Refill: Capillary refill takes less than 2 seconds.  Neurological:     General: No focal deficit present.     Mental Status: She is alert, oriented to person, place, and time and easily aroused. Mental status is at baseline.     GCS: GCS eye subscore is 4. GCS verbal subscore is 5. GCS motor subscore is 6.     Cranial Nerves: Cranial nerves 2-12 are intact.     Sensory: Sensation is intact.     Motor: No weakness, tremor, atrophy, abnormal muscle tone or pronator drift.     Coordination: Coordination is intact. Romberg sign negative. Coordination normal. Finger-Nose-Finger Test and Heel to [facility] Test normal. Rapid alternating movements normal.     Gait: Gait is intact. Gait and tandem walk normal.  Psychiatric:        Attention and Perception: Attention and perception normal.        Mood and Affect: Mood and affect normal.        Speech: Speech normal.        Behavior: Behavior normal. Behavior is cooperative.        Thought Content: Thought content normal. Thought content does not include homicidal or  suicidal ideation. Thought content does not include homicidal or suicidal plan.        Cognition and Memory: Cognition and memory normal.        Judgment: Judgment normal.     Results for orders placed or performed during the hospital encounter of 02/06/19  SARS CORONAVIRUS 2 (TAT 6-24 HRS) Nasopharyngeal Nasopharyngeal Swab   Specimen: Nasopharyngeal Swab  Result Value Ref Range   SARS Coronavirus 2 NEGATIVE NEGATIVE  Culture, beta strep (group b only)   Specimen: Vaginal/Rectal; Genital  Result Value Ref Range   Specimen Description VAGINAL/RECTAL    Special Requests NONE    Culture      NO GROUP B STREP (S.AGALACTIAE) ISOLATED Performed at [facility], [address], [place], [place] [address]    Report Status 02/08/2019 FINAL   CBC  Result Value Ref Range   WBC 9.2 4.0 - 10.5 K/uL   RBC 3.82 (L) 3.87 - 5.11 MIL/uL   Hemoglobin 12.0 12.0 - 15.0 g/dL   HCT 21.3 (L) 08.6 - 57.8 %   MCV 92.9 80.0 - 100.0 fL   MCH 31.4 26.0 - 34.0 pg   MCHC 33.8 30.0 - 36.0 g/dL   RDW 46.9 62.9 - 52.8 %   Platelets 258 150 - 400 K/uL   nRBC 0.0 0.0 - 0.2 %  RPR  Result Value Ref Range   RPR Ser Ql NON REACTIVE NON REACTIVE  Protein / creatinine ratio, urine  Result Value Ref Range   Creatinine, Urine 36.76 mg/dL   Total Protein, Urine 24 mg/dL   Protein Creatinine Ratio 0.65 (H) 0.00 - 0.15 mg/mg[Cre]  Comprehensive metabolic panel  Result Value Ref Range  Sodium 136 135 - 145 mmol/L   Potassium 3.5 3.5 - 5.1 mmol/L   Chloride 102 98 - 111 mmol/L   CO2 21 (L) 22 - 32 mmol/L   Glucose, Bld 75 70 - 99 mg/dL   BUN 5 (L) 6 - 20 mg/dL   Creatinine, Ser 0.98 0.44 - 1.00 mg/dL   Calcium 8.7 (L) 8.9 - 10.3 mg/dL   Total Protein 6.9 6.5 - 8.1 g/dL   Albumin 2.8 (L) 3.5 - 5.0 g/dL   AST 19 15 - 41 U/L   ALT 17 0 - 44 U/L   Alkaline Phosphatase 182 (H) 38 - 126 U/L   Total Bilirubin 0.5 0.3 - 1.2 mg/dL   GFR calc non Af Amer >60 >60 mL/min   GFR calc Af Amer >60 >60 mL/min    Anion gap 13 5 - 15  CBC  Result Value Ref Range   WBC 12.9 (H) 4.0 - 10.5 K/uL   RBC 3.88 3.87 - 5.11 MIL/uL   Hemoglobin 12.1 12.0 - 15.0 g/dL   HCT 11.9 (L) 14.7 - 82.9 %   MCV 89.9 80.0 - 100.0 fL   MCH 31.2 26.0 - 34.0 pg   MCHC 34.7 30.0 - 36.0 g/dL   RDW 56.2 13.0 - 86.5 %   Platelets 241 150 - 400 K/uL   nRBC 0.0 0.0 - 0.2 %  Type and screen [facility]  Result Value Ref Range   ABO/RH(D) B POS    Antibody Screen NEG    Sample Expiration      02/09/2019,2359 Performed at [facility], [address] [place], [place] [address]   ABO/Rh  Result Value Ref Range   ABO/RH(D)      B POS Performed at [facility], [address], [place], [place] [address]   Surgical pathology  Result Value Ref Range   SURGICAL PATHOLOGY      SURGICAL PATHOLOGY CASE: [id number] PATIENT: Cassidy Braun Surgical Pathology Report     Clinical History: PP, BTL, undesired fertility; [redacted]w[redacted]d (jmc)     FINAL MICROSCOPIC DIAGNOSIS:  A. FALLOPIAN TUBE, LEFT, TUBAL LIGATION: - Segment of fallopian tube with complete cross section  B. FALLOPIAN TUBE, RIGHT, TUBAL LIGATION: - Segment of fallopian tube with complete cross section     GROSS DESCRIPTION:  A: The specimen is received fresh and consists of a fimbriated fallopian tube, measuring 6.5 cm in length 0.7 cm in diameter.  The serosal surface is tan-pink, smooth, hyperemic.  Sectioning the fallopian tube reveals a tan-pink cut surface with a pinpoint lumen.  Representative sections are submitted in 1 cassette.  B: The specimen is received fresh and consists of a fimbriated fallopian tube, measuring 6.8 cm in length by 0.6 cm in diameter.  Serosal surface is tan-pink, hyperemic, with a 1.2 cm greatest dimension paratubal cyst. Sectioning the fal lopian to reveals a tan-pink cut surface with a pinpoint lumen.  Representative sections are submitted in 1 cassette. Lovey Newcomer 02/09/2019)    Final  Diagnosis performed by Holley Bouche, MD.   Electronically signed 02/09/2019 Technical component performed at [facility], [address] [place], [place] [address].  Professional component performed at [employer], [address], [place], [place] [address].  Immunohistochemistry Technical component (if applicable) was performed at [facility] [address], [place], [place] [address].   IMMUNOHISTOCHEMISTRY DISCLAIMER (if applicable): Some of these immunohistochemical stains may have been developed  and the performance characteristics determine by [facility]. Some may not have been cleared or approved by the U.S. Food and Drug Administration. The FDA has determined that such clearance or approval is not necessary. This test is used for clinic al purposes. It should not be regarded as investigational or for research. This laboratory is certified under the Clinical Laboratory Improvement Amendments of 1988 (CLIA-88) as qualified to perform high complexity clinical laboratory testing.  The controls stained appropriately.        01/13/2023    9:28 AM 09/04/2022    1:29 PM 08/23/2018    9:14 AM 07/06/2018    3:23 PM  Depression screen PHQ 2/9  Decreased Interest 0 0 0 0  Down, Depressed, Hopeless 0 0 0 0  PHQ - 2 Score 0 0 0 0  Altered sleeping 1 1 1    Tired, decreased energy 0 0 3   Change in appetite 0 0 3   Feeling bad or failure about yourself  0 0 0   Trouble concentrating 0 0 0   Moving slowly or fidgety/restless 0 0 0   Suicidal thoughts 0 0 0   PHQ-9 Score 1 1 7    Difficult doing work/chores Not difficult at all Not difficult at all         01/13/2023    9:38 AM 09/04/2022    1:30 PM  GAD 7 : Generalized Anxiety Score  Nervous, Anxious, on Edge 0 0  Control/stop worrying 0 0  Worry too much - different things 0 0  Trouble relaxing 0 0  Restless 0 0  Easily annoyed or irritable 0 0   Afraid - awful might happen 0 0  Total GAD 7 Score 0 0  Anxiety Difficulty Not difficult at all Not difficult at all   Pertinent labs & imaging results that were available during my care of the patient were reviewed by me and considered in my medical decision making.  Assessment & Plan:  Lanese was seen today for follow-up.  Diagnoses and all orders for this visit:  Atypical migraine Reassuring neurological exam in office today.  Will start medication as below for patient and referred to neurology to ensure follow up.  Reviewed notes from [place], [place] at Urgent Care and Miquel Dunn, DO on 01/05/23 from [facility]. Reviewed Echocardiogram 01/05/23, given results, comfortable starting triptan as below. Reviewed MRI Brain WO Contrast on 01/05/23.  -     SUMAtriptan (IMITREX) 50 MG tablet; Take 1 tablet (50 mg total) by mouth as directed. May repeat in 2 hours if headache persists or recurs. Max daily 100 mg -     Ambulatory referral to Neurology  Continue all other maintenance medications.  Follow up plan: Return in about 6 months (around 07/14/2023), or if symptoms worsen or fail to improve, for Chronic Condition Follow up.  Continue healthy lifestyle choices, including diet (rich in fruits, vegetables, and lean proteins, and low in salt and simple carbohydrates) and exercise (at least 30 minutes of moderate physical activity daily).  Written and verbal instructions provided   The above assessment and management plan was discussed with the patient. The patient verbalized understanding of and has agreed to the management plan. Patient is aware to call the clinic if they develop any new symptoms or if symptoms persist or worsen. Patient is aware when to return to the clinic for a follow-up visit. Patient educated on when it is appropriate to go to the emergency department.   Neale Burly,  DNP-FNP [facility] [address] [place], [place] [address] [phone]

## 2023-01-14 ENCOUNTER — Encounter: Payer: Self-pay | Admitting: Neurology

## 2023-01-22 ENCOUNTER — Encounter: Payer: Self-pay | Admitting: *Deleted

## 2023-02-11 NOTE — Progress Notes (Unsigned)
Initial neurology clinic note  Alec Diperna MRN: [id number] DOB: 1985/10/24  Referring provider: Arrie Senate*  Primary care provider: Arrie Senate, FNP  Reason for consult:  headaches  Subjective:  This is Ms. Cassidy Braun, a 37 y.o. ***-handed female with a medical history of lipoma of head, current smoker*** who presents to neurology clinic with ***. The patient is accompanied by ***.  ***  Patient presented to [facility] for headache with left sided weakness. She was admitted on 01/04/23. Headache and symptoms improved after IV toradol, benadryl, and reglan. Brain imaging was normal.  4-5 migraines per week Getting worse Blurry vision, nausea, feeling week Only using OTC medications Started on Sumatriptan by PCP on 01/13/23  Current smoker Caffeine use: EtOH use: Restrictive diet: Family history:  Saw neurology in the past?***  MEDICATIONS:  Outpatient Encounter Medications as of 02/25/2023  Medication Sig   SUMAtriptan (IMITREX) 50 MG tablet Take 1 tablet (50 mg total) by mouth as directed. May repeat in 2 hours if headache persists or recurs. Max daily 100 mg   No facility-administered encounter medications on file as of 02/25/2023.    PAST MEDICAL HISTORY: Past Medical History:  Diagnosis Date   Anemia    Dysmenorrhea 07/09/2015   Frequent headaches    lipoma tumor on brain   Lipoma of head    Nausea and vomiting 07/09/2015   Pelvic pressure in female 07/09/2015   Recurrent boils    RUQ pain 07/09/2015    PAST SURGICAL HISTORY: Past Surgical History:  Procedure Laterality Date   APPENDECTOMY     CHOLECYSTECTOMY N/A 09/02/2015   Procedure: LAPAROSCOPIC CHOLECYSTECTOMY;  Surgeon: Franky Macho, MD;  Location: [facility];  Service: General;  Laterality: N/A;   INDUCED ABORTION  2005   TUBAL LIGATION Bilateral 02/07/2019   Procedure: POST PARTUM TUBAL LIGATION;  Surgeon: Levie Heritage, DO;  Location: [facility] LD ORS;   Service: Gynecology;  Laterality: Bilateral;    ALLERGIES: No Known Allergies  FAMILY HISTORY: Family History  Problem Relation Age of Onset   Congestive Heart Failure Mother    Asthma Mother    Diabetes Mother    Hypertension Mother    COPD Mother    Cancer Mother        liver   Diabetes Father    Hypertension Father    Stroke Father    Fibroids Sister    Cirrhosis Maternal Grandmother    Other Sister        ovarian cysts    SOCIAL HISTORY: Social History   Tobacco Use   Smoking status: Every Day    Current packs/day: 0.00    Types: Cigarettes   Smokeless tobacco: Never   Tobacco comments:    smokes 4-5  cig daily  Vaping Use   Vaping status: Never Used  Substance Use Topics   Alcohol use: Not Currently    Comment: occ   Drug use: Yes    Types: Marijuana   Social History   Social History Narrative   Not on file    Objective:  Vital Signs:  There were no vitals taken for this visit.  ***  Labs and Imaging review: Internal labs: Lab Results  Component Value Date   TSH 1.370 05/31/2017    External labs: 01/05/23-01/04/23: BMP: unremarkable CBC: unremarkable HbA1c: 4.9 Lipid panel: tChol 149, LDL 53, TG 81 Mg: 1.6  Imaging: MRI brain wo contrast (01/05/23): FINDINGS:  Brain: Diffusion imaging does not show any acute  or subacute  infarction or other cause of restricted diffusion. 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 No abnormality seen  affecting the brainstem or cerebellum. Cerebral hemispheres do not  show evidence of atrophy, stroke, intra-axial mass, hemorrhage,  hydrocephalus or extra-axial fluid collection.   Vascular: Major vessels at the base of the brain show flow.   Skull and upper cervical spine: Negative   Sinuses/Orbits: Clear/normal   Other: None    IMPRESSION:  No acute finding by MRI. 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   CTA head and neck (01/04/23): FINDINGS:  CTA NECK FINDINGS   Aortic arch: Great vessel origins are patent.   Right carotid system: No evidence of dissection, stenosis (50% or  greater), or occlusion.   Left carotid system: No evidence of dissection, stenosis (50% or  greater), or occlusion.   Vertebral arteries: Right dominant. No evidence of dissection,  stenosis (50% or greater), or occlusion.   Skeleton: No acute abnormality limited assessment.   Other neck: No acute abnormality on limited assessment.   Upper chest: Visualized lung apices are clear.   Review of the MIP images confirms the above findings   CTA HEAD FINDINGS   Anterior circulation: Bilateral intracranial ICAs, MCAs, and ACAs  are patent without proximal hemodynamically significant stenosis.  Hypoplastic right A1 ACA, likely congenital.   Posterior circulation: Bilateral intradural vertebral arteries,  basilar artery and bilateral posterior cerebral arteries are patent  without proximal hemodynamically significant stenosis.   Venous sinuses: As permitted by contrast timing, patent.   Review of the MIP images confirms the above findings   IMPRESSION:  No emergent large vessel occlusion or proximal hemodynamically  significant stenosis.   CT head wo contrast (01/04/23): FINDINGS:  Brain: No substantial change in intracranial lipoma along the  superior surface of the corpus callosum and involving the cistern of  the V limb interpositum. No evidence of hydrocephalus, acute large  vascular territory infarct, new mass lesion or midline shift.   Vascular: No hyperdense vessel.   Skull: Normal. Negative for fracture or focal lesion.   Sinuses/Orbits: Sinuses no  acute orbital findings   IMPRESSION:  No evidence of acute intracranial abnormality.   Similar chronic congenital lipoma.   MRI lumbar spine wo contrast (06/25/22): FINDINGS: Segmentation:  Standard.   Alignment:  Physiologic.   Vertebrae:  No fracture, evidence of discitis, or bone lesion.   Conus medullaris and cauda equina: Conus extends to the L1 level. Conus and cauda equina appear normal.   Paraspinal and other soft tissues: Negative.   Disc levels:   T12-L1 to L3-L4: Negative.   L4-L5:  Tiny shallow left foraminal disc protrusion.  No stenosis.   L5-S1:  Negative.   IMPRESSION: 1. Minimal degenerative disc disease at L4-L5. No stenosis or impingement.    Assessment/Plan:  Cassidy Braun is a 37 y.o. female who presents for evaluation of ***. *** has a relevant medical history of ***. *** neurological examination is pertinent for ***. Available diagnostic data is significant for ***. This constellation of symptoms and objective data would most likely localize to ***. ***  PLAN: -Blood work: *** ***  -Return to clinic ***  The impression above  as well as the plan as outlined below were extensively discussed with the patient (in the company of ***) who voiced understanding. All questions were answered to their satisfaction.  The patient was counseled on pertinent fall precautions per the printed material provided today, and as noted under the "Patient Instructions" section below.***  When available, results of the above investigations and possible further recommendations will be communicated to the patient via telephone/MyChart. Patient to call office if not contacted after expected testing turnaround time.   Total time spent reviewing records, interview, history/exam, documentation, and coordination of care on day of encounter:  *** min   Thank you for allowing me to participate in patient's care.  If I can answer any additional questions, I would be pleased  to do so.  Jacquelyne Balint, MD   CC: Ellamae Sia Aleen Campi, FNP [address] [place] [address]  CC: Referring provider: Arrie Senate, FNP [address] [place],  [place] [address]

## 2023-02-25 ENCOUNTER — Ambulatory Visit: Payer: Medicaid Other | Admitting: Neurology

## 2023-04-01 NOTE — Progress Notes (Deleted)
Initial neurology clinic note  Cari Burgo MRN: [id number] DOB: 1985-10-21  Referring provider: Arrie Senate*  Primary care provider: Arrie Senate, FNP  Reason for consult:  headaches  Subjective:  This is Ms. Cassidy Braun, a 38 y.o. ***-handed female with a medical history of lipoma of head, current smoker*** who presents to neurology clinic with ***. The patient is accompanied by ***.  ***  Patient presented to [facility] for headache with left sided weakness. She was admitted on 01/04/23. Headache and symptoms improved after IV toradol, benadryl, and reglan. Brain imaging was normal.   4-5 migraines per week Getting worse Blurry vision, nausea, feeling week Only using OTC medications Started on Sumatriptan by PCP on 01/13/23   Current smoker Caffeine use: EtOH use: Restrictive diet: Family history:   Saw neurology in the past?***  MEDICATIONS:  Outpatient Encounter Medications as of 04/14/2023  Medication Sig   SUMAtriptan (IMITREX) 50 MG tablet Take 1 tablet (50 mg total) by mouth as directed. May repeat in 2 hours if headache persists or recurs. Max daily 100 mg   No facility-administered encounter medications on file as of 04/14/2023.    PAST MEDICAL HISTORY: Past Medical History:  Diagnosis Date   Anemia    Dysmenorrhea 07/09/2015   Frequent headaches    lipoma tumor on brain   Lipoma of head    Nausea and vomiting 07/09/2015   Pelvic pressure in female 07/09/2015   Recurrent boils    RUQ pain 07/09/2015    PAST SURGICAL HISTORY: Past Surgical History:  Procedure Laterality Date   APPENDECTOMY     CHOLECYSTECTOMY N/A 09/02/2015   Procedure: LAPAROSCOPIC CHOLECYSTECTOMY;  Surgeon: Franky Macho, MD;  Location: [facility];  Service: General;  Laterality: N/A;   INDUCED ABORTION  2005   TUBAL LIGATION Bilateral 02/07/2019   Procedure: POST PARTUM TUBAL LIGATION;  Surgeon: Levie Heritage, DO;  Location: [facility];   Service: Gynecology;  Laterality: Bilateral;    ALLERGIES: No Known Allergies  FAMILY HISTORY: Family History  Problem Relation Age of Onset   Congestive Heart Failure Mother    Asthma Mother    Diabetes Mother    Hypertension Mother    COPD Mother    Cancer Mother        liver   Diabetes Father    Hypertension Father    Stroke Father    Fibroids Sister    Cirrhosis Maternal Grandmother    Other Sister        ovarian cysts    SOCIAL HISTORY: Social History   Tobacco Use   Smoking status: Every Day    Current packs/day: 0.00    Types: Cigarettes   Smokeless tobacco: Never   Tobacco comments:    smokes 4-5  cig daily  Vaping Use   Vaping status: Never Used  Substance Use Topics   Alcohol use: Not Currently    Comment: occ   Drug use: Yes    Types: Marijuana   Social History   Social History Narrative   Not on file    Objective:  Vital Signs:  There were no vitals taken for this visit.  ***  Labs and Imaging review: Internal labs: Lab Results  Component Value Date   TSH 1.370 05/31/2017    External labs: 01/05/23-01/04/23: BMP: unremarkable CBC: unremarkable HbA1c: 4.9 Lipid panel: tChol 149, LDL 53, TG 81 Mg: 1.6   Imaging: MRI brain wo contrast (01/05/23): FINDINGS:  Brain: Diffusion imaging does  not show any acute or subacute  infarction or other cause of restricted diffusion. 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 No abnormality seen  affecting the brainstem or cerebellum. Cerebral hemispheres do not  show evidence of atrophy, stroke, intra-axial mass, hemorrhage,  hydrocephalus or extra-axial fluid collection.   Vascular: Major vessels at the base of the brain show flow.   Skull and upper cervical spine: Negative   Sinuses/Orbits: Clear/normal   Other: None    IMPRESSION:  No acute finding by MRI. 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    CTA head and neck (01/04/23): FINDINGS:  CTA NECK FINDINGS   Aortic arch: Great vessel origins are patent.   Right carotid system: No evidence of dissection, stenosis (50% or  greater), or occlusion.   Left carotid system: No evidence of dissection, stenosis (50% or  greater), or occlusion.   Vertebral arteries: Right dominant. No evidence of dissection,  stenosis (50% or greater), or occlusion.   Skeleton: No acute abnormality limited assessment.   Other neck: No acute abnormality on limited assessment.   Upper chest: Visualized lung apices are clear.   Review of the MIP images confirms the above findings   CTA HEAD FINDINGS   Anterior circulation: Bilateral intracranial ICAs, MCAs, and ACAs  are patent without proximal hemodynamically significant stenosis.  Hypoplastic right A1 ACA, likely congenital.   Posterior circulation: Bilateral intradural vertebral arteries,  basilar artery and bilateral posterior cerebral arteries are patent  without proximal hemodynamically significant stenosis.   Venous sinuses: As permitted by contrast timing, patent.   Review of the MIP images confirms the above findings   IMPRESSION:  No emergent large vessel occlusion or proximal hemodynamically  significant stenosis.    CT head wo contrast (01/04/23): FINDINGS:  Brain: No substantial change in intracranial lipoma along the  superior surface of the corpus callosum and involving the cistern of  the V limb interpositum. No evidence of hydrocephalus, acute large  vascular territory infarct, new mass lesion or midline shift.   Vascular: No hyperdense vessel.   Skull: Normal. Negative for fracture or focal lesion.   Sinuses/Orbits: Sinuses  no acute orbital findings   IMPRESSION:  No evidence of acute intracranial abnormality.   Similar chronic congenital lipoma.    MRI lumbar spine wo contrast (06/25/22): FINDINGS: Segmentation:  Standard.   Alignment:  Physiologic.   Vertebrae:  No fracture, evidence of discitis, or bone lesion.   Conus medullaris and cauda equina: Conus extends to the L1 level. Conus and cauda equina appear normal.   Paraspinal and other soft tissues: Negative.   Disc levels:   T12-L1 to L3-L4: Negative.   L4-L5:  Tiny shallow left foraminal disc protrusion.  No stenosis.   L5-S1:  Negative.   IMPRESSION: 1. Minimal degenerative disc disease at L4-L5. No stenosis or impingement.  Assessment/Plan:  Larcenia Holaday is a 38 y.o. female who presents for evaluation of ***. *** has a relevant medical history of ***. *** neurological examination is pertinent for ***. Available diagnostic data is significant for ***. This constellation of symptoms and objective data would most likely localize to ***. ***  PLAN: -Blood work: *** ***  -Return to clinic ***  The impression above as well as the plan as outlined below were extensively discussed with the patient (in the company of ***) who voiced understanding. All questions were answered to their satisfaction.  The patient was counseled on pertinent fall precautions per the printed material provided today, and as noted under the "Patient Instructions" section below.***  When available, results of the above investigations and possible further recommendations will be communicated to the patient via telephone/MyChart. Patient to call office if not contacted after expected testing turnaround time.   Total time spent reviewing records, interview, history/exam, documentation, and coordination of care on day of encounter:  *** min   Thank you for allowing me to participate in patient's care.  If I can answer any additional questions, I would be pleased  to do so.  Jacquelyne Balint, MD   CC: Ellamae Sia Aleen Campi, FNP [address] [place] [address]  CC: Referring provider: Arrie Senate, FNP [address] [place],  [place] [address]

## 2023-04-14 ENCOUNTER — Ambulatory Visit: Payer: Medicaid Other | Admitting: Neurology

## 2023-05-17 DIAGNOSIS — R059 Cough, unspecified: Secondary | ICD-10-CM | POA: Diagnosis not present

## 2023-05-17 DIAGNOSIS — R070 Pain in throat: Secondary | ICD-10-CM | POA: Diagnosis not present

## 2023-05-17 DIAGNOSIS — J101 Influenza due to other identified influenza virus with other respiratory manifestations: Secondary | ICD-10-CM | POA: Diagnosis not present

## 2023-05-17 DIAGNOSIS — Z20822 Contact with and (suspected) exposure to covid-19: Secondary | ICD-10-CM | POA: Diagnosis not present

## 2023-05-18 NOTE — Progress Notes (Deleted)
 Initial neurology clinic note  Afton Lavalle MRN: [id number] DOB: [birth year]  Referring provider: Arrie Senate*  Primary care provider: Arrie Senate, FNP  Reason for consult:  headaches  Subjective:  This is Ms. Debara Pickett, a 38 y.o. ***-handed female with a medical history of lipoma of head, current smoker*** who presents to neurology clinic with ***. The patient is accompanied by ***.  ***  Patient presented to [facility] for headache with left sided weakness. She was admitted on 01/04/23. Headache and symptoms improved after IV toradol, benadryl, and reglan. Brain imaging was normal.   4-5 migraines per week Getting worse Blurry vision, nausea, feeling week Only using OTC medications Started on Sumatriptan by PCP on 01/13/23   Current smoker Caffeine use: EtOH use: Restrictive diet: Family history:   Saw neurology in the past?***  MEDICATIONS:  Outpatient Encounter Medications as of 05/27/2023  Medication Sig   SUMAtriptan (IMITREX) 50 MG tablet Take 1 tablet (50 mg total) by mouth as directed. May repeat in 2 hours if headache persists or recurs. Max daily 100 mg   No facility-administered encounter medications on file as of 05/27/2023.    PAST MEDICAL HISTORY: Past Medical History:  Diagnosis Date   Anemia    Dysmenorrhea 07/09/2015   Frequent headaches    lipoma tumor on brain   Lipoma of head    Nausea and vomiting 07/09/2015   Pelvic pressure in female 07/09/2015   Recurrent boils    RUQ pain 07/09/2015    PAST SURGICAL HISTORY: Past Surgical History:  Procedure Laterality Date   APPENDECTOMY     CHOLECYSTECTOMY N/A 09/02/2015   Procedure: LAPAROSCOPIC CHOLECYSTECTOMY;  Surgeon: Franky Macho, MD;  Location: [facility];  Service: General;  Laterality: N/A;   INDUCED ABORTION  2005   TUBAL LIGATION Bilateral 02/07/2019   Procedure: POST PARTUM TUBAL LIGATION;  Surgeon: Levie Heritage, DO;  Location: [facility];   Service: Gynecology;  Laterality: Bilateral;    ALLERGIES: No Known Allergies  FAMILY HISTORY: Family History  Problem Relation Age of Onset   Congestive Heart Failure Mother    Asthma Mother    Diabetes Mother    Hypertension Mother    COPD Mother    Cancer Mother        liver   Diabetes Father    Hypertension Father    Stroke Father    Fibroids Sister    Cirrhosis Maternal Grandmother    Other Sister        ovarian cysts    SOCIAL HISTORY: Social History   Tobacco Use   Smoking status: Every Day    Current packs/day: 0.00    Types: Cigarettes   Smokeless tobacco: Never   Tobacco comments:    smokes 4-5  cig daily  Vaping Use   Vaping status: Never Used  Substance Use Topics   Alcohol use: Not Currently    Comment: occ   Drug use: Yes    Types: Marijuana   Social History   Social History Narrative   Not on file    Objective:  Vital Signs:  There were no vitals taken for this visit.  ***  Labs and Imaging review: Internal labs: TSH (05/31/2017): wnl  External labs: 01/05/23-01/04/23: BMP: unremarkable CBC: unremarkable HbA1c: 4.9 Lipid panel: tChol 149, LDL 53, TG 81 Mg: 1.6   Imaging: MRI brain wo contrast (01/05/23): FINDINGS:  Brain: Diffusion imaging does not show any acute or subacute  infarction or other  cause of restricted diffusion. 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 No abnormality seen  affecting the brainstem or cerebellum. Cerebral hemispheres do not  show evidence of atrophy, stroke, intra-axial mass, hemorrhage,  hydrocephalus or extra-axial fluid collection.   Vascular: Major vessels at the base of the brain show flow.   Skull and upper cervical spine: Negative   Sinuses/Orbits: Clear/normal   Other: None   IMPRESSION:  No acute finding by MRI. Congenital  lipoma wrapping around the  corpus callosum and with a component associated with the choroid  plexus of the left lateral ventricle as seen previously should not  be of clinical relevance.    CTA head and neck (01/04/23): FINDINGS:  CTA NECK FINDINGS   Aortic arch: Great vessel origins are patent.   Right carotid system: No evidence of dissection, stenosis (50% or  greater), or occlusion.   Left carotid system: No evidence of dissection, stenosis (50% or  greater), or occlusion.   Vertebral arteries: Right dominant. No evidence of dissection,  stenosis (50% or greater), or occlusion.   Skeleton: No acute abnormality limited assessment.   Other neck: No acute abnormality on limited assessment.   Upper chest: Visualized lung apices are clear.   Review of the MIP images confirms the above findings   CTA HEAD FINDINGS   Anterior circulation: Bilateral intracranial ICAs, MCAs, and ACAs  are patent without proximal hemodynamically significant stenosis.  Hypoplastic right A1 ACA, likely congenital.   Posterior circulation: Bilateral intradural vertebral arteries,  basilar artery and bilateral posterior cerebral arteries are patent  without proximal hemodynamically significant stenosis.   Venous sinuses: As permitted by contrast timing, patent.   Review of the MIP images confirms the above findings   IMPRESSION:  No emergent large vessel occlusion or proximal hemodynamically  significant stenosis.    CT head wo contrast (01/04/23): FINDINGS:  Brain: No substantial change in intracranial lipoma along the  superior surface of the corpus callosum and involving the cistern of  the V limb interpositum. No evidence of hydrocephalus, acute large  vascular territory infarct, new mass lesion or midline shift.   Vascular: No hyperdense vessel.   Skull: Normal. Negative for fracture or focal lesion.   Sinuses/Orbits: Sinuses no acute orbital findings   IMPRESSION:  No evidence  of acute intracranial abnormality.   Similar chronic congenital lipoma.    MRI lumbar spine wo contrast (06/25/22): FINDINGS: Segmentation:  Standard.   Alignment:  Physiologic.   Vertebrae:  No fracture, evidence of discitis, or bone lesion.   Conus medullaris and cauda equina: Conus extends to the L1 level. Conus and cauda equina appear normal.   Paraspinal and other soft tissues: Negative.   Disc levels:   T12-L1 to L3-L4: Negative.   L4-L5:  Tiny shallow left foraminal disc protrusion.  No stenosis.   L5-S1:  Negative.   IMPRESSION: 1. Minimal degenerative disc disease at L4-L5. No stenosis or impingement.  Assessment/Plan:  Adryan Druckenmiller is a 38 y.o. female who presents for evaluation of ***. *** has a relevant medical history of ***. *** neurological examination is pertinent for ***. Available diagnostic data is significant for ***. This constellation of symptoms and objective data would most likely localize to ***. ***  PLAN: -Blood work: *** ***  -Return to clinic ***  The impression above as well as the plan  as outlined below were extensively discussed with the patient (in the company of ***) who voiced understanding. All questions were answered to their satisfaction.  The patient was counseled on pertinent fall precautions per the printed material provided today, and as noted under the "Patient Instructions" section below.***  When available, results of the above investigations and possible further recommendations will be communicated to the patient via telephone/MyChart. Patient to call office if not contacted after expected testing turnaround time.   Total time spent reviewing records, interview, history/exam, documentation, and coordination of care on day of encounter:  *** min   Thank you for allowing me to participate in patient's care.  If I can answer any additional questions, I would be pleased to do so.  Jacquelyne Balint, MD   CC: Ellamae Sia Aleen Campi, FNP [address] [place] [address]  CC: Referring provider: Arrie Senate, FNP [address] [place],  [place] [address]

## 2023-05-27 ENCOUNTER — Ambulatory Visit: Payer: Medicaid Other | Admitting: Neurology

## 2023-07-16 ENCOUNTER — Encounter: Payer: Self-pay | Admitting: Family Medicine

## 2023-07-16 ENCOUNTER — Ambulatory Visit: Payer: Medicaid Other | Admitting: Family Medicine

## 2023-09-30 DIAGNOSIS — M542 Cervicalgia: Secondary | ICD-10-CM | POA: Diagnosis not present

## 2023-09-30 DIAGNOSIS — R9431 Abnormal electrocardiogram [ECG] [EKG]: Secondary | ICD-10-CM | POA: Diagnosis not present

## 2023-09-30 DIAGNOSIS — Z79899 Other long term (current) drug therapy: Secondary | ICD-10-CM | POA: Diagnosis not present

## 2023-09-30 DIAGNOSIS — R079 Chest pain, unspecified: Secondary | ICD-10-CM | POA: Diagnosis not present

## 2023-09-30 DIAGNOSIS — F17210 Nicotine dependence, cigarettes, uncomplicated: Secondary | ICD-10-CM | POA: Diagnosis not present

## 2023-09-30 DIAGNOSIS — R519 Headache, unspecified: Secondary | ICD-10-CM | POA: Diagnosis not present

## 2023-09-30 DIAGNOSIS — R109 Unspecified abdominal pain: Secondary | ICD-10-CM | POA: Diagnosis not present

## 2024-02-07 DIAGNOSIS — I498 Other specified cardiac arrhythmias: Secondary | ICD-10-CM | POA: Diagnosis not present

## 2024-02-07 DIAGNOSIS — R079 Chest pain, unspecified: Secondary | ICD-10-CM | POA: Diagnosis not present

## 2024-02-07 DIAGNOSIS — R0602 Shortness of breath: Secondary | ICD-10-CM | POA: Diagnosis not present

## 2024-02-07 DIAGNOSIS — Z20822 Contact with and (suspected) exposure to covid-19: Secondary | ICD-10-CM | POA: Diagnosis not present

## 2024-02-07 DIAGNOSIS — R0789 Other chest pain: Secondary | ICD-10-CM | POA: Diagnosis not present

## 2024-02-07 DIAGNOSIS — Z720 Tobacco use: Secondary | ICD-10-CM | POA: Diagnosis not present

## 2024-02-07 DIAGNOSIS — F17210 Nicotine dependence, cigarettes, uncomplicated: Secondary | ICD-10-CM | POA: Diagnosis not present

## 2024-02-07 DIAGNOSIS — R059 Cough, unspecified: Secondary | ICD-10-CM | POA: Diagnosis not present

## 2024-02-07 DIAGNOSIS — J40 Bronchitis, not specified as acute or chronic: Secondary | ICD-10-CM | POA: Diagnosis not present

## 2024-02-07 DIAGNOSIS — R9431 Abnormal electrocardiogram [ECG] [EKG]: Secondary | ICD-10-CM | POA: Diagnosis not present
